# Patient Record
Sex: Male | Born: 1945 | Race: Black or African American | Hispanic: No | Marital: Married | Smoking: Former smoker
Health system: Southern US, Community
[De-identification: ages and names within clinical notes are randomized; demographics above are authoritative.]

## PROBLEM LIST (undated history)

## (undated) DIAGNOSIS — I5022 Chronic systolic (congestive) heart failure: Secondary | ICD-10-CM

## (undated) DIAGNOSIS — N184 Chronic kidney disease, stage 4 (severe): Secondary | ICD-10-CM

## (undated) DIAGNOSIS — R0989 Other specified symptoms and signs involving the circulatory and respiratory systems: Secondary | ICD-10-CM

## (undated) DIAGNOSIS — I2510 Atherosclerotic heart disease of native coronary artery without angina pectoris: Secondary | ICD-10-CM

## (undated) DIAGNOSIS — G4733 Obstructive sleep apnea (adult) (pediatric): Secondary | ICD-10-CM

## (undated) DIAGNOSIS — E119 Type 2 diabetes mellitus without complications: Secondary | ICD-10-CM

## (undated) DIAGNOSIS — I428 Other cardiomyopathies: Secondary | ICD-10-CM

## (undated) DIAGNOSIS — I10 Essential (primary) hypertension: Secondary | ICD-10-CM

## (undated) DIAGNOSIS — Z720 Tobacco use: Secondary | ICD-10-CM

## (undated) HISTORY — PX: ABDOMINAL HERNIA REPAIR: SHX539

## (undated) HISTORY — DX: Obstructive sleep apnea (adult) (pediatric): G47.33

## (undated) HISTORY — PX: CARDIAC CATHETERIZATION: SHX172

## (undated) HISTORY — DX: Other specified symptoms and signs involving the circulatory and respiratory systems: R09.89

---

## 2007-02-17 DIAGNOSIS — R0989 Other specified symptoms and signs involving the circulatory and respiratory systems: Secondary | ICD-10-CM

## 2007-02-17 HISTORY — DX: Other specified symptoms and signs involving the circulatory and respiratory systems: R09.89

## 2016-03-27 DIAGNOSIS — R0600 Dyspnea, unspecified: Secondary | ICD-10-CM | POA: Diagnosis not present

## 2016-03-28 ENCOUNTER — Inpatient Hospital Stay
Admission: EM | Admit: 2016-03-28 | Discharge: 2016-04-04 | DRG: 280 | Disposition: A | Discharge status: Home Health | Payer: Medicare Other | Attending: Internal Medicine | Admitting: Internal Medicine

## 2016-03-28 ENCOUNTER — Inpatient Hospital Stay (HOSPITAL_COMMUNITY)
Admit: 2016-03-28 | Discharge: 2016-03-28 | Disposition: A | Discharge status: Home / Self Care | Payer: Medicare Other | Attending: Internal Medicine | Admitting: Internal Medicine

## 2016-03-28 ENCOUNTER — Encounter: Payer: Self-pay | Admitting: Internal Medicine

## 2016-03-28 ENCOUNTER — Emergency Department: Payer: Medicare Other

## 2016-03-28 DIAGNOSIS — N184 Chronic kidney disease, stage 4 (severe): Secondary | ICD-10-CM | POA: Diagnosis not present

## 2016-03-28 DIAGNOSIS — I420 Dilated cardiomyopathy: Secondary | ICD-10-CM

## 2016-03-28 DIAGNOSIS — I5031 Acute diastolic (congestive) heart failure: Secondary | ICD-10-CM

## 2016-03-28 DIAGNOSIS — I10 Essential (primary) hypertension: Secondary | ICD-10-CM

## 2016-03-28 DIAGNOSIS — R338 Other retention of urine: Secondary | ICD-10-CM | POA: Diagnosis present

## 2016-03-28 DIAGNOSIS — I472 Ventricular tachycardia, unspecified: Secondary | ICD-10-CM

## 2016-03-28 DIAGNOSIS — J9601 Acute respiratory failure with hypoxia: Secondary | ICD-10-CM | POA: Diagnosis present

## 2016-03-28 DIAGNOSIS — N183 Chronic kidney disease, stage 3 unspecified: Secondary | ICD-10-CM

## 2016-03-28 DIAGNOSIS — E785 Hyperlipidemia, unspecified: Secondary | ICD-10-CM | POA: Diagnosis present

## 2016-03-28 DIAGNOSIS — I255 Ischemic cardiomyopathy: Secondary | ICD-10-CM | POA: Diagnosis not present

## 2016-03-28 DIAGNOSIS — F17200 Nicotine dependence, unspecified, uncomplicated: Secondary | ICD-10-CM

## 2016-03-28 DIAGNOSIS — N400 Enlarged prostate without lower urinary tract symptoms: Secondary | ICD-10-CM | POA: Diagnosis present

## 2016-03-28 DIAGNOSIS — I130 Hypertensive heart and chronic kidney disease with heart failure and stage 1 through stage 4 chronic kidney disease, or unspecified chronic kidney disease: Secondary | ICD-10-CM | POA: Diagnosis present

## 2016-03-28 DIAGNOSIS — I214 Non-ST elevation (NSTEMI) myocardial infarction: Principal | ICD-10-CM | POA: Diagnosis present

## 2016-03-28 DIAGNOSIS — N179 Acute kidney failure, unspecified: Secondary | ICD-10-CM | POA: Diagnosis not present

## 2016-03-28 DIAGNOSIS — E1122 Type 2 diabetes mellitus with diabetic chronic kidney disease: Secondary | ICD-10-CM | POA: Diagnosis not present

## 2016-03-28 DIAGNOSIS — E1121 Type 2 diabetes mellitus with diabetic nephropathy: Secondary | ICD-10-CM | POA: Diagnosis present

## 2016-03-28 DIAGNOSIS — I5033 Acute on chronic diastolic (congestive) heart failure: Secondary | ICD-10-CM | POA: Diagnosis not present

## 2016-03-28 DIAGNOSIS — I160 Hypertensive urgency: Secondary | ICD-10-CM | POA: Diagnosis not present

## 2016-03-28 DIAGNOSIS — I428 Other cardiomyopathies: Secondary | ICD-10-CM

## 2016-03-28 DIAGNOSIS — F17210 Nicotine dependence, cigarettes, uncomplicated: Secondary | ICD-10-CM | POA: Diagnosis present

## 2016-03-28 DIAGNOSIS — I5023 Acute on chronic systolic (congestive) heart failure: Secondary | ICD-10-CM | POA: Diagnosis not present

## 2016-03-28 DIAGNOSIS — R002 Palpitations: Secondary | ICD-10-CM | POA: Diagnosis not present

## 2016-03-28 DIAGNOSIS — M6250 Muscle wasting and atrophy, not elsewhere classified, unspecified site: Secondary | ICD-10-CM | POA: Diagnosis present

## 2016-03-28 DIAGNOSIS — J432 Centrilobular emphysema: Secondary | ICD-10-CM | POA: Diagnosis not present

## 2016-03-28 DIAGNOSIS — Z7984 Long term (current) use of oral hypoglycemic drugs: Secondary | ICD-10-CM | POA: Diagnosis not present

## 2016-03-28 DIAGNOSIS — I5043 Acute on chronic combined systolic (congestive) and diastolic (congestive) heart failure: Secondary | ICD-10-CM | POA: Diagnosis present

## 2016-03-28 DIAGNOSIS — N171 Acute kidney failure with acute cortical necrosis: Secondary | ICD-10-CM | POA: Diagnosis not present

## 2016-03-28 DIAGNOSIS — N2581 Secondary hyperparathyroidism of renal origin: Secondary | ICD-10-CM | POA: Diagnosis not present

## 2016-03-28 DIAGNOSIS — R531 Weakness: Secondary | ICD-10-CM | POA: Diagnosis present

## 2016-03-28 DIAGNOSIS — E1140 Type 2 diabetes mellitus with diabetic neuropathy, unspecified: Secondary | ICD-10-CM | POA: Diagnosis present

## 2016-03-28 DIAGNOSIS — R0603 Acute respiratory distress: Secondary | ICD-10-CM | POA: Diagnosis not present

## 2016-03-28 DIAGNOSIS — J438 Other emphysema: Secondary | ICD-10-CM

## 2016-03-28 DIAGNOSIS — I272 Pulmonary hypertension, unspecified: Secondary | ICD-10-CM | POA: Diagnosis not present

## 2016-03-28 DIAGNOSIS — I5022 Chronic systolic (congestive) heart failure: Secondary | ICD-10-CM | POA: Diagnosis not present

## 2016-03-28 DIAGNOSIS — R748 Abnormal levels of other serum enzymes: Secondary | ICD-10-CM | POA: Diagnosis not present

## 2016-03-28 DIAGNOSIS — I2510 Atherosclerotic heart disease of native coronary artery without angina pectoris: Secondary | ICD-10-CM | POA: Diagnosis not present

## 2016-03-28 DIAGNOSIS — Z79899 Other long term (current) drug therapy: Secondary | ICD-10-CM

## 2016-03-28 DIAGNOSIS — I509 Heart failure, unspecified: Secondary | ICD-10-CM

## 2016-03-28 DIAGNOSIS — Z720 Tobacco use: Secondary | ICD-10-CM

## 2016-03-28 HISTORY — DX: Essential (primary) hypertension: I10

## 2016-03-28 HISTORY — DX: Type 2 diabetes mellitus without complications: E11.9

## 2016-03-28 LAB — TSH: TSH: 2.607 u[IU]/mL (ref 0.350–4.500)

## 2016-03-28 LAB — CBC WITH DIFFERENTIAL/PLATELET
BASOS ABS: 0.1 10*3/uL (ref 0–0.1)
BASOS PCT: 2 %
Eosinophils Absolute: 0.4 10*3/uL (ref 0–0.7)
Eosinophils Relative: 5 %
HCT: 40.4 % (ref 40.0–52.0)
HEMOGLOBIN: 13.2 g/dL (ref 13.0–18.0)
Lymphocytes Relative: 23 %
Lymphs Abs: 1.7 10*3/uL (ref 1.0–3.6)
MCH: 29.2 pg (ref 26.0–34.0)
MCHC: 32.7 g/dL (ref 32.0–36.0)
MCV: 89.3 fL (ref 80.0–100.0)
Monocytes Absolute: 0.5 10*3/uL (ref 0.2–1.0)
Monocytes Relative: 7 %
NEUTROS PCT: 63 %
Neutro Abs: 4.8 10*3/uL (ref 1.4–6.5)
Platelets: 235 10*3/uL (ref 150–440)
RBC: 4.52 MIL/uL (ref 4.40–5.90)
RDW: 14.6 % — ABNORMAL HIGH (ref 11.5–14.5)
WBC: 7.5 10*3/uL (ref 3.8–10.6)

## 2016-03-28 LAB — GLUCOSE, CAPILLARY
Glucose-Capillary: 167 mg/dL — ABNORMAL HIGH (ref 65–99)
Glucose-Capillary: 161 mg/dL — ABNORMAL HIGH (ref 65–99)
Glucose-Capillary: 230 mg/dL — ABNORMAL HIGH (ref 65–99)

## 2016-03-28 LAB — COMPREHENSIVE METABOLIC PANEL
ALBUMIN: 3.6 g/dL (ref 3.5–5.0)
ALK PHOS: 56 U/L (ref 38–126)
ALT: 12 U/L — AB (ref 17–63)
ANION GAP: 8 (ref 5–15)
AST: 23 U/L (ref 15–41)
BUN: 22 mg/dL — AB (ref 6–20)
CO2: 24 mmol/L (ref 22–32)
Calcium: 8.4 mg/dL — ABNORMAL LOW (ref 8.9–10.3)
Chloride: 103 mmol/L (ref 101–111)
Creatinine, Ser: 2.07 mg/dL — ABNORMAL HIGH (ref 0.61–1.24)
GFR calc Af Amer: 35 mL/min — ABNORMAL LOW (ref >60)
GFR calc non Af Amer: 31 mL/min — ABNORMAL LOW (ref >60)
GLUCOSE: 187 mg/dL — AB (ref 65–99)
Potassium: 4.3 mmol/L (ref 3.5–5.1)
SODIUM: 135 mmol/L (ref 135–145)
Total Bilirubin: 0.6 mg/dL (ref 0.3–1.2)
Total Protein: 6.6 g/dL (ref 6.5–8.1)

## 2016-03-28 LAB — ECHOCARDIOGRAM COMPLETE
Height: 72 in
Weight: 2606.4 oz

## 2016-03-28 LAB — TROPONIN I
Troponin I: 0.07 ng/mL (ref <0.03)
Troponin I: 0.15 ng/mL (ref <0.03)
Troponin I: 1.12 ng/mL (ref <0.03)
Troponin I: 1.92 ng/mL (ref <0.03)

## 2016-03-28 LAB — BRAIN NATRIURETIC PEPTIDE: B Natriuretic Peptide: 1941 pg/mL — ABNORMAL HIGH (ref 0.0–100.0)

## 2016-03-28 MED ORDER — FUROSEMIDE 10 MG/ML IJ SOLN
60.0000 mg | Freq: Two times a day (BID) | INTRAMUSCULAR | Status: DC
  Administered 2016-03-28 – 2016-03-29 (×3): 60 mg via INTRAVENOUS
  Filled 2016-03-28 (×3): qty 6

## 2016-03-28 MED ORDER — NITROGLYCERIN 2 % TD OINT
TOPICAL_OINTMENT | TRANSDERMAL | Status: AC
  Administered 2016-03-28: 0.5 [in_us] via TOPICAL
  Filled 2016-03-28: qty 1

## 2016-03-28 MED ORDER — LABETALOL HCL 5 MG/ML IV SOLN
INTRAVENOUS | Status: AC
  Filled 2016-03-28: qty 4

## 2016-03-28 MED ORDER — INSULIN ASPART 100 UNIT/ML ~~LOC~~ SOLN
0.0000 [IU] | Freq: Every day | SUBCUTANEOUS | Status: DC
  Administered 2016-03-28: 2 [IU] via SUBCUTANEOUS
  Administered 2016-04-01: 3 [IU] via SUBCUTANEOUS
  Administered 2016-04-02: 2 [IU] via SUBCUTANEOUS
  Filled 2016-03-28: qty 2
  Filled 2016-03-28: qty 3
  Filled 2016-03-28: qty 2

## 2016-03-28 MED ORDER — VITAMIN B-12 1000 MCG PO TABS
1000.0000 ug | ORAL_TABLET | Freq: Every day | ORAL | Status: DC
  Administered 2016-03-28 – 2016-04-04 (×8): 1000 ug via ORAL
  Filled 2016-03-28 (×8): qty 1

## 2016-03-28 MED ORDER — HEPARIN (PORCINE) IN NACL 100-0.45 UNIT/ML-% IJ SOLN
1200.0000 [IU]/h | INTRAMUSCULAR | Status: DC
  Administered 2016-03-28: 900 [IU]/h via INTRAVENOUS
  Administered 2016-03-29 – 2016-03-30 (×2): 1050 [IU]/h via INTRAVENOUS
  Administered 2016-03-31: 1200 [IU]/h via INTRAVENOUS
  Filled 2016-03-28 (×4): qty 250

## 2016-03-28 MED ORDER — ENALAPRIL MALEATE 10 MG PO TABS
20.0000 mg | ORAL_TABLET | Freq: Two times a day (BID) | ORAL | Status: DC
  Administered 2016-03-28: 20 mg via ORAL
  Filled 2016-03-28: qty 2

## 2016-03-28 MED ORDER — LABETALOL HCL 5 MG/ML IV SOLN
10.0000 mg | Freq: Once | INTRAVENOUS | Status: AC
  Administered 2016-03-28: 10 mg via INTRAVENOUS

## 2016-03-28 MED ORDER — SODIUM CHLORIDE 0.9% FLUSH
3.0000 mL | Freq: Two times a day (BID) | INTRAVENOUS | Status: DC
  Administered 2016-03-28 – 2016-04-04 (×12): 3 mL via INTRAVENOUS

## 2016-03-28 MED ORDER — HEPARIN SODIUM (PORCINE) 5000 UNIT/ML IJ SOLN
5000.0000 [IU] | Freq: Three times a day (TID) | INTRAMUSCULAR | Status: DC
  Administered 2016-03-28 (×2): 5000 [IU] via SUBCUTANEOUS
  Filled 2016-03-28 (×2): qty 1

## 2016-03-28 MED ORDER — NITROGLYCERIN 2 % TD OINT
1.0000 [in_us] | TOPICAL_OINTMENT | Freq: Once | TRANSDERMAL | Status: AC
  Administered 2016-03-28: 1 [in_us] via TOPICAL

## 2016-03-28 MED ORDER — AMLODIPINE BESYLATE 5 MG PO TABS
5.0000 mg | ORAL_TABLET | Freq: Every day | ORAL | Status: DC
  Administered 2016-03-28: 5 mg via ORAL
  Filled 2016-03-28: qty 1

## 2016-03-28 MED ORDER — ISOSORBIDE MONONITRATE ER 30 MG PO TB24
30.0000 mg | ORAL_TABLET | Freq: Every day | ORAL | Status: DC
  Administered 2016-03-28 – 2016-03-30 (×3): 30 mg via ORAL
  Filled 2016-03-28 (×3): qty 1

## 2016-03-28 MED ORDER — PRAVASTATIN SODIUM 40 MG PO TABS
40.0000 mg | ORAL_TABLET | Freq: Every day | ORAL | Status: DC
  Administered 2016-03-28 – 2016-04-04 (×8): 40 mg via ORAL
  Filled 2016-03-28 (×8): qty 1

## 2016-03-28 MED ORDER — NITROGLYCERIN 2 % TD OINT
0.5000 [in_us] | TOPICAL_OINTMENT | Freq: Once | TRANSDERMAL | Status: AC
  Administered 2016-03-28: 0.5 [in_us] via TOPICAL

## 2016-03-28 MED ORDER — INSULIN ASPART 100 UNIT/ML ~~LOC~~ SOLN
0.0000 [IU] | Freq: Three times a day (TID) | SUBCUTANEOUS | Status: DC
  Administered 2016-03-28 (×2): 3 [IU] via SUBCUTANEOUS
  Administered 2016-03-29: 2 [IU] via SUBCUTANEOUS
  Administered 2016-03-29: 5 [IU] via SUBCUTANEOUS
  Administered 2016-03-30 – 2016-03-31 (×3): 3 [IU] via SUBCUTANEOUS
  Administered 2016-03-31: 8 [IU] via SUBCUTANEOUS
  Administered 2016-04-01: 3 [IU] via SUBCUTANEOUS
  Administered 2016-04-01: 2 [IU] via SUBCUTANEOUS
  Administered 2016-04-01: 5 [IU] via SUBCUTANEOUS
  Administered 2016-04-02 (×2): 3 [IU] via SUBCUTANEOUS
  Administered 2016-04-02: 5 [IU] via SUBCUTANEOUS
  Administered 2016-04-03: 2 [IU] via SUBCUTANEOUS
  Administered 2016-04-03: 5 [IU] via SUBCUTANEOUS
  Administered 2016-04-04: 2 [IU] via SUBCUTANEOUS
  Administered 2016-04-04: 3 [IU] via SUBCUTANEOUS
  Filled 2016-03-28: qty 5
  Filled 2016-03-28 (×3): qty 3
  Filled 2016-03-28 (×2): qty 5
  Filled 2016-03-28: qty 8
  Filled 2016-03-28 (×2): qty 2
  Filled 2016-03-28: qty 5
  Filled 2016-03-28: qty 2
  Filled 2016-03-28 (×4): qty 3
  Filled 2016-03-28: qty 2
  Filled 2016-03-28 (×2): qty 3

## 2016-03-28 MED ORDER — ALFUZOSIN HCL ER 10 MG PO TB24
10.0000 mg | ORAL_TABLET | Freq: Every day | ORAL | Status: DC
  Administered 2016-03-28 – 2016-04-03 (×7): 10 mg via ORAL
  Filled 2016-03-28 (×8): qty 1

## 2016-03-28 MED ORDER — ONDANSETRON HCL 4 MG/2ML IJ SOLN: 4.0000 mg | Freq: Four times a day (QID) | INTRAMUSCULAR | Status: DC | PRN

## 2016-03-28 MED ORDER — LABETALOL HCL 5 MG/ML IV SOLN
INTRAVENOUS | Status: AC
  Administered 2016-03-28: 10 mg via INTRAVENOUS
  Filled 2016-03-28: qty 4

## 2016-03-28 MED ORDER — AMLODIPINE BESYLATE 5 MG PO TABS
5.0000 mg | ORAL_TABLET | Freq: Two times a day (BID) | ORAL | Status: DC
  Administered 2016-03-28 – 2016-03-30 (×4): 5 mg via ORAL
  Filled 2016-03-28 (×4): qty 1

## 2016-03-28 MED ORDER — ACETAMINOPHEN 650 MG RE SUPP: 650.0000 mg | Freq: Four times a day (QID) | RECTAL | Status: DC | PRN

## 2016-03-28 MED ORDER — TAMSULOSIN HCL 0.4 MG PO CAPS
0.4000 mg | ORAL_CAPSULE | Freq: Every day | ORAL | Status: DC
  Administered 2016-03-28 – 2016-04-04 (×8): 0.4 mg via ORAL
  Filled 2016-03-28 (×8): qty 1

## 2016-03-28 MED ORDER — DOCUSATE SODIUM 100 MG PO CAPS
100.0000 mg | ORAL_CAPSULE | Freq: Two times a day (BID) | ORAL | Status: DC
  Administered 2016-03-28 – 2016-04-04 (×15): 100 mg via ORAL
  Filled 2016-03-28 (×16): qty 1

## 2016-03-28 MED ORDER — PANTOPRAZOLE SODIUM 40 MG PO TBEC
80.0000 mg | DELAYED_RELEASE_TABLET | Freq: Every day | ORAL | Status: DC
  Administered 2016-03-28 – 2016-03-31 (×4): 80 mg via ORAL
  Filled 2016-03-28 (×4): qty 2

## 2016-03-28 MED ORDER — HYDRALAZINE HCL 50 MG PO TABS
100.0000 mg | ORAL_TABLET | Freq: Three times a day (TID) | ORAL | Status: DC
  Administered 2016-03-28 – 2016-04-04 (×22): 100 mg via ORAL
  Filled 2016-03-28 (×22): qty 2

## 2016-03-28 MED ORDER — ONDANSETRON HCL 4 MG PO TABS: 4.0000 mg | ORAL_TABLET | Freq: Four times a day (QID) | ORAL | Status: DC | PRN

## 2016-03-28 MED ORDER — FUROSEMIDE 10 MG/ML IJ SOLN
60.0000 mg | Freq: Once | INTRAMUSCULAR | Status: AC
  Administered 2016-03-28: 60 mg via INTRAVENOUS

## 2016-03-28 MED ORDER — ACETAMINOPHEN 325 MG PO TABS: 650.0000 mg | ORAL_TABLET | Freq: Four times a day (QID) | ORAL | Status: DC | PRN

## 2016-03-28 MED ORDER — FUROSEMIDE 20 MG PO TABS
20.0000 mg | ORAL_TABLET | Freq: Every day | ORAL | Status: DC
  Administered 2016-03-28: 20 mg via ORAL
  Filled 2016-03-28: qty 1

## 2016-03-28 MED ORDER — ALFUZOSIN HCL ER 10 MG PO TB24: 10.0000 mg | ORAL_TABLET | Freq: Every day | ORAL | Status: DC

## 2016-03-28 NOTE — Progress Notes (Signed)
Notified Dr. Rockey Situ of patient's troponin 1.92. Patient does not have chest pain.

## 2016-03-28 NOTE — ED Notes (Signed)
Family at bedside. 

## 2016-03-28 NOTE — ED Notes (Signed)
Lab called with critical Trop of 0.07, MD notified.

## 2016-03-28 NOTE — H&P (Signed)
Colton Thompson is an 71 y.o. male.   Chief Complaint: Shortness of breath HPI: The patient with past medical history of congestive heart failure, hypertension and diabetes presents emergency department due to shortness of breath. The patient states that he was unable to catch his breath as he usually is able to rest. He has had orthopnea for quite some time and sleeps in a recliner. Tonight his dyspnea became too great and he was transported to the emergency department. He was immediately placed on BiPAP and given Lasix IV which improved his respiratory effort. The patient reports having a CPAP machine but is not consistent with use as it is uncomfortable while sleeping. The patient denies chest pain, nausea, vomiting or diaphoresis. Once the patient was stabilized in the emergency department the hospitalist service was called for admission.  Past Medical History:  Diagnosis Date  . CHF (congestive heart failure) ([place])   . DM (diabetes mellitus), type 2 ([place])   . HTN (hypertension)     Past Surgical History:  Procedure Laterality Date  . ABDOMINAL HERNIA REPAIR      Family History  Problem Relation Age of Onset  . Stroke Mother    Social History:  has no tobacco, alcohol, and drug history on file.  Allergies: No Known Allergies  Medications Prior to Admission  Medication Sig Dispense Refill  . albuterol (PROVENTIL HFA;VENTOLIN HFA) 108 (90 Base) MCG/ACT inhaler Inhale 1-2 puffs into the lungs every 6 (six) hours as needed for wheezing or shortness of breath.    . alfuzosin (UROXATRAL) 10 MG 24 hr tablet Take 10 mg by mouth at bedtime.    . enalapril (VASOTEC) 20 MG tablet Take 20 mg by mouth 2 (two) times daily.    . furosemide (LASIX) 20 MG tablet Take 20 mg by mouth daily.    . hydrALAZINE (APRESOLINE) 100 MG tablet Take 100 mg by mouth 3 (three) times daily.    . insulin glargine (LANTUS) 100 UNIT/ML injection Inject 15 Units into the skin at bedtime.    . metFORMIN (GLUCOPHAGE)  1000 MG tablet Take 1,000-1,500 mg by mouth 2 (two) times daily with a meal. 1016m in the morning and 1500 mg in the evening    . omeprazole (PRILOSEC) 20 MG capsule Take 20 mg by mouth 2 (two) times daily before a meal.    . pravastatin (PRAVACHOL) 40 MG tablet Take 40 mg by mouth daily.    . vitamin B-12 (CYANOCOBALAMIN) 1000 MCG tablet Take 1,000 mcg by mouth daily.      Results for orders placed or performed during the hospital encounter of 03/28/16 (from the past 48 hour(s))  CBC with Differential     Status: Abnormal   Collection Time: 03/28/16 12:06 AM  Result Value Ref Range   WBC 7.5 3.8 - 10.6 K/uL   RBC 4.52 4.40 - 5.90 MIL/uL   Hemoglobin 13.2 13.0 - 18.0 g/dL   HCT 40.4 40.0 - 52.0 %   MCV 89.3 80.0 - 100.0 fL   MCH 29.2 26.0 - 34.0 pg   MCHC 32.7 32.0 - 36.0 g/dL   RDW 14.6 (H) 11.5 - 14.5 %   Platelets 235 150 - 440 K/uL   Neutrophils Relative % 63 %   Neutro Abs 4.8 1.4 - 6.5 K/uL   Lymphocytes Relative 23 %   Lymphs Abs 1.7 1.0 - 3.6 K/uL   Monocytes Relative 7 %   Monocytes Absolute 0.5 0.2 - 1.0 K/uL   Eosinophils Relative 5 %  Eosinophils Absolute 0.4 0 - 0.7 K/uL   Basophils Relative 2 %   Basophils Absolute 0.1 0 - 0.1 K/uL  Comprehensive metabolic panel     Status: Abnormal   Collection Time: 03/28/16 12:06 AM  Result Value Ref Range   Sodium 135 135 - 145 mmol/L   Potassium 4.3 3.5 - 5.1 mmol/L   Chloride 103 101 - 111 mmol/L   CO2 24 22 - 32 mmol/L   Glucose, Bld 187 (H) 65 - 99 mg/dL   BUN 22 (H) 6 - 20 mg/dL   Creatinine, Ser 2.07 (H) 0.61 - 1.24 mg/dL   Calcium 8.4 (L) 8.9 - 10.3 mg/dL   Total Protein 6.6 6.5 - 8.1 g/dL   Albumin 3.6 3.5 - 5.0 g/dL   AST 23 15 - 41 U/L   ALT 12 (L) 17 - 63 U/L   Alkaline Phosphatase 56 38 - 126 U/L   Total Bilirubin 0.6 0.3 - 1.2 mg/dL   GFR calc non Af Amer 31 (L) >60 mL/min   GFR calc Af Amer 35 (L) >60 mL/min    Comment: (NOTE) The eGFR has been calculated using the CKD EPI equation. This calculation  has not been validated in all clinical situations. eGFR's persistently <60 mL/min signify possible Chronic Kidney Disease.    Anion gap 8 5 - 15  Troponin I     Status: Abnormal   Collection Time: 03/28/16 12:06 AM  Result Value Ref Range   Troponin I 0.07 (HH) <0.03 ng/mL    Comment: CRITICAL RESULT CALLED TO, READ BACK BY AND VERIFIED WITH DAVID WALKER AT 5621 03/28/16.PMH  Brain natriuretic peptide     Status: Abnormal   Collection Time: 03/28/16 12:06 AM  Result Value Ref Range   B Natriuretic Peptide 1,941.0 (H) 0.0 - 100.0 pg/mL  TSH     Status: None   Collection Time: 03/28/16  4:52 AM  Result Value Ref Range   TSH 2.607 0.350 - 4.500 uIU/mL    Comment: Performed by a 3rd Generation assay with a functional sensitivity of <=0.01 uIU/mL.  Troponin I     Status: Abnormal   Collection Time: 03/28/16  4:52 AM  Result Value Ref Range   Troponin I 0.15 (HH) <0.03 ng/mL    Comment: CRITICAL VALUE NOTED. VALUE IS CONSISTENT WITH PREVIOUSLY REPORTED/CALLED VALUE.PMH   Dg Chest 1 View  Result Date: 03/28/2016 CLINICAL DATA:  Respiratory distress.  Emesis. EXAM: CHEST 1 VIEW COMPARISON:  None. FINDINGS: Normal heart size and pulmonary vascularity. Diffuse perihilar peribronchial thickening with diffuse interstitial pattern to the lungs. This may represent interstitial pneumonia or edema. No blunting of costophrenic angles. No pneumothorax. Mediastinal contours appear intact. Calcification of the aorta. IMPRESSION: Peribronchial thickening with diffuse interstitial pattern to the lungs suggesting interstitial pneumonia or edema. Electronically Signed   By: Lucienne Capers M.D.   On: 03/28/2016 00:25    Review of Systems  Constitutional: Negative for chills and fever.  HENT: Negative for sore throat and tinnitus.   Eyes: Negative for blurred vision and redness.  Respiratory: Positive for shortness of breath. Negative for cough.   Cardiovascular: Positive for orthopnea. Negative for chest  pain, palpitations and PND.  Gastrointestinal: Negative for abdominal pain, diarrhea, nausea and vomiting.  Genitourinary: Negative for dysuria, frequency and urgency.  Musculoskeletal: Negative for joint pain and myalgias.  Skin: Negative for rash.       No lesions  Neurological: Negative for speech change, focal weakness and weakness.  Endo/Heme/Allergies:  Does not bruise/bleed easily.       No temperature intolerance  Psychiatric/Behavioral: Negative for depression and suicidal ideas.    Blood pressure (!) 157/83, pulse 79, temperature 98.3 F (36.8 C), temperature source Oral, resp. rate 16, height 6' (1.829 m), weight 73.9 kg (162 lb 14.4 oz), SpO2 97 %. Physical Exam  Constitutional: He is oriented to person, place, and time. He appears well-developed and well-nourished. No distress.  HENT:  Head: Normocephalic and atraumatic.  Mouth/Throat: Oropharynx is clear and moist.  Eyes: Conjunctivae and EOM are normal. Pupils are equal, round, and reactive to light. No scleral icterus.  Neck: Normal range of motion. Neck supple. No JVD present. No tracheal deviation present. No thyromegaly present.  Cardiovascular: Normal rate, regular rhythm and normal heart sounds.  Exam reveals no gallop and no friction rub.   No murmur heard. Respiratory: Breath sounds normal. He is in respiratory distress. He has no wheezes. He has no rales.  On BiPAP  GI: Soft. Bowel sounds are normal. He exhibits no distension. There is no tenderness.  Genitourinary:  Genitourinary Comments: Deferred  Musculoskeletal: Normal range of motion. He exhibits no edema.  Lymphadenopathy:    He has no cervical adenopathy.  Neurological: He is alert and oriented to person, place, and time. No cranial nerve deficit.  Skin: Skin is warm and dry. No rash noted. No erythema.  Psychiatric: He has a normal mood and affect. His behavior is normal. Judgment and thought content normal.     Assessment/Plan This is a  71 year old male admitted for CHF exacerbation. 1. Congestive heart failure: Acute on chronic; likely combined systolic and diastolic. Continue diuresis. Adjust Lasix dose as necessary. Blood pressure is uncontrolled and likely worsening cardiac output. 2. Hypertension: Uncontrolled: Continue hydralazine and amlodipine. Labetalol as needed. The patient has Nitropaste on his chest. Consider holding enalapril due to acute kidney injury. 3. Acute kidney injury: Baseline creatinine unavailable. 4. Diabetes mellitus: Check hemoglobin A1c; sliding scale insulin while hospitalized hold oral hyperglycemic agents. 5. Hyperlipidemia: Continue statin therapy 6. BPH: Continue Uroxatral 7. DVT prophylaxis: Heparin 8. GI prophylaxis: Pantoprazole per home regimen The patient is a full code. I'm spent on admission orders and patient care approximately 45 minutes  Harrie Foreman, MD 03/28/2016, 7:11 AM

## 2016-03-28 NOTE — ED Provider Notes (Signed)
[facility] Emergency Department Provider Note   First MD Initiated Contact with Patient 03/28/16 0009     (approximate)  I have reviewed the triage vital signs and the nursing notes.   HISTORY  Chief Complaint Respiratory Distress   HPI Colton Thompson is a 71 y.o. male with history of congestive heart failure presents to the emergency department via EMS for respiratory distress. Per EMS initial call was for shortness of breath on their arrival the patient's oxygen saturation was 92% on room air with considerable work of breathing and in addition the patient's blood pressure on arrival was 230/150. Patient's blood pressure arrival to the emergency department 225/123 status post 3 sublingual nitroglycerin that were administered by EMS. EMS also notified that the patient had 1 episode of vomiting with. To be food particles followed by another episode of vomiting which was frothy sputum. Patient denies any chest pain.   Past Medical History:  Diagnosis Date  . CHF (congestive heart failure) ([place])   . DM (diabetes mellitus), type 2 ([place])   . HTN (hypertension)     Patient Active Problem List   Diagnosis Date Noted  . Acute on chronic combined systolic and diastolic CHF (congestive heart failure) ([place]) 03/28/2016  . Acute renal failure superimposed on stage 3 chronic kidney disease ([place])   . Dilated cardiomyopathy ([place])   . Hypertensive urgency   . Centrilobular emphysema ([place])   . Smoker   . Pulmonary hypertension     Past Surgical History:  Procedure Laterality Date  . ABDOMINAL HERNIA REPAIR      Prior to Admission medications   Medication Sig Start Date End Date Taking? Authorizing Provider  albuterol (PROVENTIL HFA;VENTOLIN HFA) 108 (90 Base) MCG/ACT inhaler Inhale 1-2 puffs into the lungs every 6 (six) hours as needed for wheezing or shortness of breath.   Yes Historical Provider, MD  alfuzosin (UROXATRAL) 10 MG 24 hr tablet Take 10 mg by mouth  at bedtime.   Yes Historical Provider, MD  enalapril (VASOTEC) 20 MG tablet Take 20 mg by mouth 2 (two) times daily.   Yes Historical Provider, MD  furosemide (LASIX) 20 MG tablet Take 20 mg by mouth daily.   Yes Historical Provider, MD  hydrALAZINE (APRESOLINE) 100 MG tablet Take 100 mg by mouth 3 (three) times daily.   Yes Historical Provider, MD  insulin glargine (LANTUS) 100 UNIT/ML injection Inject 15 Units into the skin at bedtime.   Yes Historical Provider, MD  metFORMIN (GLUCOPHAGE) 1000 MG tablet Take 1,000-1,500 mg by mouth 2 (two) times daily with a meal. 1000mg  in the morning and 1500 mg in the evening   Yes Historical Provider, MD  omeprazole (PRILOSEC) 20 MG capsule Take 20 mg by mouth 2 (two) times daily before a meal.   Yes Historical Provider, MD  pravastatin (PRAVACHOL) 40 MG tablet Take 40 mg by mouth daily.   Yes Historical Provider, MD  vitamin B-12 (CYANOCOBALAMIN) 1000 MCG tablet Take 1,000 mcg by mouth daily.   Yes Historical Provider, MD    Allergies Patient has no known allergies.  Family History  Problem Relation Age of Onset  . Stroke Mother     Social History Social History  Substance Use Topics  . Smoking status: Not on file  . Smokeless tobacco: Not on file  . Alcohol use Not on file    Review of Systems Constitutional: No fever/chills Eyes: No visual changes. ENT: No sore throat. Cardiovascular: Denies chest pain. Respiratory: Positive  for shortness of breath. Gastrointestinal: No abdominal pain.  No nausea,Positive for vomiting Genitourinary: Negative for dysuria. Musculoskeletal: Negative for back pain. Skin: Negative for rash. Neurological: Negative for headaches, focal weakness or numbness.  10-point ROS otherwise negative.  ____________________________________________   PHYSICAL EXAM:  VITAL SIGNS: ED Triage Vitals  Enc Vitals Group     BP 03/28/16 0005 (!) 225/123     Pulse Rate 03/28/16 0005 (!) 115     Resp 03/28/16 0005 (!) 26      Temp 03/28/16 0005 (!) 94.3 F (34.6 C)     Temp Source 03/28/16 0005 Axillary     SpO2 03/28/16 0005 100 %     Weight 03/28/16 0006 162 lb (73.5 kg)     Height --      Head Circumference --      Peak Flow --      Pain Score --      Pain Loc --      Pain Edu? --      Excl. in [place]? --     Constitutional: Alert and oriented. Apparent respiratory distress Eyes: Conjunctivae are normal. PERRL. EOMI. Head: Atraumatic. Nose: No congestion/rhinnorhea. Mouth/Throat: Mucous membranes are moist. Oropharynx non-erythematous. Neck: No stridor.   Cardiovascular: Tachycardia, regular rhythm. Good peripheral circulation. Diastolic murmur noted Respiratory: Tachypnea, positive accessory respiratory muscle use, Bibasilar rales. Speaking in 3 word phrases Gastrointestinal: Soft and nontender. No distention.  Musculoskeletal: No lower extremity tenderness nor edema. No gross deformities of extremities. Neurologic:  Normal speech and language. No gross focal neurologic deficits are appreciated.  Skin:  Skin is warm, dry and intact. No rash noted. Psychiatric: Mood and affect are normal. Speech and behavior are normal.  ____________________________________________   LABS (all labs ordered are listed, but only abnormal results are displayed)  Labs Reviewed  CBC WITH DIFFERENTIAL/PLATELET - Abnormal; Notable for the following:       Result Value   RDW 14.6 (*)    All other components within normal limits  COMPREHENSIVE METABOLIC PANEL - Abnormal; Notable for the following:    Glucose, Bld 187 (*)    BUN 22 (*)    Creatinine, Ser 2.07 (*)    Calcium 8.4 (*)    ALT 12 (*)    GFR calc non Af Amer 31 (*)    GFR calc Af Amer 35 (*)    All other components within normal limits  TROPONIN I - Abnormal; Notable for the following:    Troponin I 0.07 (*)    All other components within normal limits  BRAIN NATRIURETIC PEPTIDE - Abnormal; Notable for the following:    B Natriuretic Peptide 1,941.0  (*)    All other components within normal limits  TROPONIN I - Abnormal; Notable for the following:    Troponin I 0.15 (*)    All other components within normal limits  TROPONIN I - Abnormal; Notable for the following:    Troponin I 1.12 (*)    All other components within normal limits  TROPONIN I - Abnormal; Notable for the following:    Troponin I 1.92 (*)    All other components within normal limits  GLUCOSE, CAPILLARY - Abnormal; Notable for the following:    Glucose-Capillary 167 (*)    All other components within normal limits  GLUCOSE, CAPILLARY - Abnormal; Notable for the following:    Glucose-Capillary 161 (*)    All other components within normal limits  GLUCOSE, CAPILLARY - Abnormal; Notable for the following:  Glucose-Capillary 230 (*)    All other components within normal limits  TSH  HEMOGLOBIN A1C  HEPARIN LEVEL (UNFRACTIONATED)   ____________________________________________  EKG  ED ECG REPORT I, [place] N BROWN, the attending physician, personally viewed and interpreted this ECG.   Date: 03/28/2016  EKG Time: 12:09 AM  Rate: 115  Rhythm: Sinus tachycardia  Axis: Normal  Intervals: Normal  ST&T Change: None  ____________________________________________  RADIOLOGY I, [place] Ernst Bowler, personally viewed and evaluated these images (plain radiographs) as part of my medical decision making, as well as reviewing the written report by the radiologist.  Dg Chest 1 View  Result Date: 03/28/2016 CLINICAL DATA:  Respiratory distress.  Emesis. EXAM: CHEST 1 VIEW COMPARISON:  None. FINDINGS: Normal heart size and pulmonary vascularity. Diffuse perihilar peribronchial thickening with diffuse interstitial pattern to the lungs. This may represent interstitial pneumonia or edema. No blunting of costophrenic angles. No pneumothorax. Mediastinal contours appear intact. Calcification of the aorta. IMPRESSION: Peribronchial thickening with diffuse interstitial pattern to  the lungs suggesting interstitial pneumonia or edema. Electronically Signed   By: Lucienne Capers M.D.   On: 03/28/2016 00:25      Procedures   Critical Care performed: CRITICAL CARE Performed by: Gregor Hams   Total critical care time: 45 minutes  Critical care time was exclusive of separately billable procedures and treating other patients.  Critical care was necessary to treat or prevent imminent or life-threatening deterioration.  Critical care was time spent personally by me on the following activities: development of treatment plan with patient and/or surrogate as well as nursing, discussions with consultants, evaluation of patient's response to treatment, examination of patient, obtaining history from patient or surrogate, ordering and performing treatments and interventions, ordering and review of laboratory studies, ordering and review of radiographic studies, pulse oximetry and re-evaluation of patient's condition.  ___________   INITIAL IMPRESSION / ASSESSMENT AND PLAN / ED COURSE  Pertinent labs & imaging results that were available during my care of the patient were reviewed by me and considered in my medical decision making (see chart for details).  71 year old male presenting to the emergency department with rest or distress most likely secondary to congestive heart failure exacerbation with possible pulmonary edema. Patient markedly hypertensive on arrival. BiPAP applied immediately upon arrival to the emergency department Lasix 60 mg IV given 1 inch of Nitropaste applied to the patient's right chest. 60 mg of Lasix administered IV. In addition the patient received labetalol 20 mg IV. Following these interventions patient respiratory effort improved as hypertension improved. Patient now able to speak in full sentences. Patient discussed with Dr. Marcille Blanco hospitalist for admission for further evaluation and management.       ____________________________________________  FINAL CLINICAL IMPRESSION(S) / ED DIAGNOSES  Final diagnoses:  None     MEDICATIONS GIVEN DURING THIS VISIT:  Medications  labetalol (NORMODYNE,TRANDATE) 5 MG/ML injection (  Canceled Entry 03/28/16 0750)  hydrALAZINE (APRESOLINE) tablet 100 mg (100 mg Oral Given 03/28/16 2226)  pravastatin (PRAVACHOL) tablet 40 mg (40 mg Oral Given 03/28/16 0812)  vitamin B-12 (CYANOCOBALAMIN) tablet 1,000 mcg (1,000 mcg Oral Given 03/28/16 0812)  alfuzosin (UROXATRAL) 24 hr tablet 10 mg (10 mg Oral Given 03/28/16 2225)  pantoprazole (PROTONIX) EC tablet 80 mg (80 mg Oral Given 03/28/16 0810)  sodium chloride flush (NS) 0.9 % injection 3 mL (3 mLs Intravenous Given 03/28/16 0813)  ondansetron (ZOFRAN) tablet 4 mg (not administered)    Or  ondansetron (ZOFRAN) injection 4 mg (not administered)  docusate sodium (COLACE) capsule 100 mg (100 mg Oral Given 03/28/16 2225)  acetaminophen (TYLENOL) tablet 650 mg (not administered)    Or  acetaminophen (TYLENOL) suppository 650 mg (not administered)  insulin aspart (novoLOG) injection 0-15 Units (3 Units Subcutaneous Given 03/28/16 1720)  insulin aspart (novoLOG) injection 0-5 Units (2 Units Subcutaneous Given 03/28/16 2226)  furosemide (LASIX) injection 60 mg (60 mg Intravenous Given 03/28/16 1718)  tamsulosin (FLOMAX) capsule 0.4 mg (0.4 mg Oral Given 03/28/16 1530)  amLODipine (NORVASC) tablet 5 mg (5 mg Oral Given 03/28/16 2225)  isosorbide mononitrate (IMDUR) 24 hr tablet 30 mg (30 mg Oral Given 03/28/16 1719)  heparin ADULT infusion 100 units/mL (25000 units/224mL sodium chloride 0.45%) (900 Units/hr Intravenous Handoff 03/28/16 1914)  furosemide (LASIX) injection 60 mg (60 mg Intravenous Given 03/28/16 0009)  nitroGLYCERIN (NITROGLYN) 2 % ointment 1 inch (1 inch Topical Given 03/28/16 0009)  labetalol (NORMODYNE,TRANDATE) injection 10 mg (10 mg Intravenous Given 03/28/16 0029)  labetalol (NORMODYNE,TRANDATE)  injection 10 mg (10 mg Intravenous Given 03/28/16 0049)  nitroGLYCERIN (NITROGLYN) 2 % ointment 0.5 inch (0.5 inches Topical Given 03/28/16 0132)     NEW OUTPATIENT MEDICATIONS STARTED DURING THIS VISIT:  Current Discharge Medication List      Current Discharge Medication List      Current Discharge Medication List       Note:  This document was prepared using Dragon voice recognition software and may include unintentional dictation errors.    Gregor Hams, MD 03/28/16 2242

## 2016-03-28 NOTE — ED Notes (Addendum)
0100 Vitals sync for pulse is inaccurate, patient is AOx4.

## 2016-03-28 NOTE — Consult Note (Addendum)
Cardiology Consultation Note  Patient ID: Colton Thompson, MRN: [id number], DOB/AGE: 1946/01/13 71 y.o. Admit date: 03/28/2016   Date of Consult: 03/28/2016 Primary Physician: Pcp Not In System Primary Cardiologist: New to [facility]  Chief Complaint: Shortness of breath, acute Reason for Consult: Congestive heart failure, elevated troponin, severe hypertension Physician requesting consult: Dr. Marcille Blanco   HPI: 71 y.o. male with h/o severe hypertension managed to the [facility], 49 year smoking history who continues to smoke , BPH, prior history of "congestive heart failure"per the patient, prior cardiac catheterization 20 years ago results unavailable who presents to the hospital after developing, acute onset of shortness of breath while Tronic of sleep last night.   In general he reports that he sleeps well with no symptoms. Denies any tachycardia or palpitations. Was lying in bed, slight incline with pillows, when he had significant difficulty breathing. Symptoms persisted. Occasionally sleeps in a recliner secondary to orthopnea Shortness of breath last night was beyond his normal baseline The patient reports having a CPAP machine but is not consistent with use as it is uncomfortable while sleeping.  He was given Lasix in the emergency room also started on BiPAP with improvement of his shortness of breath symptoms. Given labetalol for severe hypertension. Also started on Nitropaste. Initial troponin low, repeat troponin 0.12, follow-up troponin #3 was 1.12 Blood pressure on arrival was 225/125 Wife reports that he's been having trouble with his blood pressure . They do have a blood pressure cuff at home  He's had difficulty urinating, since he has been inpatient has required periodic catheterizations for urine retention. At home takes prostate medication, uroxatrol. Flomax has been added here Blood pressure continues to run high, 177 this evening systolic  For blood pressure takes hydralazine  100 mrem 3 times a day, enalapril 20 mg daily Amlodipine 5 mg has been added  He does have a smoking history for 50 years and continues to smoke    Past Medical History:  Diagnosis Date  . CHF (congestive heart failure) ([place])   . DM (diabetes mellitus), type 2 ([place])   . HTN (hypertension)       Most Recent Cardiac Studies: Echocardiogram performed today showing mildly dilated left ventricle, mild LVH, moderately reduced LV function , ejection fraction 20 to 25%, mildly dilated left atrium    Surgical History:  Past Surgical History:  Procedure Laterality Date  . ABDOMINAL HERNIA REPAIR       Home Meds: Prior to Admission medications   Medication Sig Start Date End Date Taking? Authorizing Provider  albuterol (PROVENTIL HFA;VENTOLIN HFA) 108 (90 Base) MCG/ACT inhaler Inhale 1-2 puffs into the lungs every 6 (six) hours as needed for wheezing or shortness of breath.   Yes Historical Provider, MD  alfuzosin (UROXATRAL) 10 MG 24 hr tablet Take 10 mg by mouth at bedtime.   Yes Historical Provider, MD  enalapril (VASOTEC) 20 MG tablet Take 20 mg by mouth 2 (two) times daily.   Yes Historical Provider, MD  furosemide (LASIX) 20 MG tablet Take 20 mg by mouth daily.   Yes Historical Provider, MD  hydrALAZINE (APRESOLINE) 100 MG tablet Take 100 mg by mouth 3 (three) times daily.   Yes Historical Provider, MD  insulin glargine (LANTUS) 100 UNIT/ML injection Inject 15 Units into the skin at bedtime.   Yes Historical Provider, MD  metFORMIN (GLUCOPHAGE) 1000 MG tablet Take 1,000-1,500 mg by mouth 2 (two) times daily with a meal. 1000mg  in the morning and 1500 mg in  the evening   Yes Historical Provider, MD  omeprazole (PRILOSEC) 20 MG capsule Take 20 mg by mouth 2 (two) times daily before a meal.   Yes Historical Provider, MD  pravastatin (PRAVACHOL) 40 MG tablet Take 40 mg by mouth daily.   Yes Historical Provider, MD  vitamin B-12 (CYANOCOBALAMIN) 1000 MCG tablet Take 1,000 mcg by mouth daily.    Yes Historical Provider, MD    Inpatient Medications:  . alfuzosin  10 mg Oral QHS  . amLODipine  5 mg Oral Daily  . docusate sodium  100 mg Oral BID  . enalapril  20 mg Oral BID  . furosemide  60 mg Intravenous BID  . heparin  5,000 Units Subcutaneous Q8H  . hydrALAZINE  100 mg Oral TID  . insulin aspart  0-15 Units Subcutaneous TID WC  . insulin aspart  0-5 Units Subcutaneous QHS  . pantoprazole  80 mg Oral Daily  . pravastatin  40 mg Oral Daily  . sodium chloride flush  3 mL Intravenous Q12H  . tamsulosin  0.4 mg Oral Daily  . vitamin B-12  1,000 mcg Oral Daily     Allergies: No Known Allergies  Social History   Social History  . Marital status: Married    Spouse name: N/A  . Number of children: N/A  . Years of education: N/A   Occupational History  . Not on file.   Social History Main Topics  . Smoking status: Not on file  . Smokeless tobacco: Not on file  . Alcohol use Not on file  . Drug use: Unknown  . Sexual activity: Not on file   Other Topics Concern  . Not on file   Social History Narrative  . No narrative on file     Family History  Problem Relation Age of Onset  . Stroke Mother      Review of Systems: Review of Systems  Constitutional: Positive for malaise/fatigue.  Respiratory: Positive for shortness of breath.   Cardiovascular: Negative.   Gastrointestinal: Negative.   Genitourinary:       Urine retention  Musculoskeletal: Negative.   Neurological: Negative.   Psychiatric/Behavioral: Negative.   All other systems reviewed and are negative.   Labs: CBC  Recent Labs  03/28/16 0006  WBC 7.5  NEUTROABS 4.8  HGB 13.2  HCT 40.4  MCV 89.3  PLT 024   Basic Metabolic Panel  Recent Labs  03/28/16 0006  NA 135  K 4.3  CL 103  CO2 24  GLUCOSE 187*  BUN 22*  CREATININE 2.07*  CALCIUM 8.4*   Liver Function Tests  Recent Labs  03/28/16 0006  AST 23  ALT 12*  ALKPHOS 56  BILITOT 0.6  PROT 6.6  ALBUMIN 3.6   No  results for input(s): LIPASE, AMYLASE in the last 72 hours. Cardiac Enzymes  Recent Labs  03/28/16 0006 03/28/16 0452 03/28/16 1038  TROPONINI 0.07* 0.15* 1.12*   BNP Invalid input(s): POCBNP D-Dimer No results for input(s): DDIMER in the last 72 hours. Hemoglobin A1C No results for input(s): HGBA1C in the last 72 hours. Fasting Lipid Panel No results for input(s): CHOL, HDL, LDLCALC, TRIG, CHOLHDL, LDLDIRECT in the last 72 hours. Thyroid Function Tests  Recent Labs  03/28/16 0452  TSH 2.607    Radiology/Studies:  Dg Chest 1 View  Result Date: 03/28/2016 CLINICAL DATA:  Respiratory distress.  Emesis. EXAM: CHEST 1 VIEW COMPARISON:  None. FINDINGS: Normal heart size and pulmonary vascularity. Diffuse perihilar peribronchial thickening with  diffuse interstitial pattern to the lungs. This may represent interstitial pneumonia or edema. No blunting of costophrenic angles. No pneumothorax. Mediastinal contours appear intact. Calcification of the aorta. IMPRESSION: Peribronchial thickening with diffuse interstitial pattern to the lungs suggesting interstitial pneumonia or edema. Electronically Signed   By: Lucienne Capers M.D.   On: 03/28/2016 00:25    EKG: EKG shows normal sinus rhythm with nonspecific ST and T wave abnormality anterolateral leads  EKG nonspecific ST and T wave abnormality lab work, chest x-ray, echocardiogram reviewed independently by myself  Weights: Filed Weights   03/28/16 0006 03/28/16 0438  Weight: 162 lb (73.5 kg) 162 lb 14.4 oz (73.9 kg)     Physical Exam:NSR Blood pressure (!) 177/96, pulse 81, temperature 98 F (36.7 C), temperature source Oral, resp. rate 20, height 6' (1.829 m), weight 162 lb 14.4 oz (73.9 kg), SpO2 98 %. Body mass index is 22.09 kg/m. GEN: Well nourished, well developed, in no acute distress.  HEENT: Grossly normal.  Neck: Supple, no JVD, carotid bruits, or masses. Cardiac: RRR, no murmurs, rubs, or gallops. No clubbing,  cyanosis, edema.  Radials/DP/PT 2+ and equal bilaterally.  Respiratory:  Respirations regular and unlabored, moderately decreased breath sounds throughout  GI: Soft, nontender, nondistended, BS + x 4. MS: no deformity or atrophy. Skin: warm and dry, no rash. Neuro:  Strength and sensation are intact. Psych: AAOx3.  Normal affect.    Assessment and Plan:   71 year old gentleman that presents with acute shortness of breath, prior history of CHF per his report, managed at the [place] , underlying renal failure creatinine 2.0 , ejection fraction 20 to 25% , long history of smoking who continues to smoke , chest x-ray concerning for edema, troponin up to 1.12 and the setting of severe hypertension on arrival 225/125   1) elevated troponin unclear at this time whether this is demand ischemia from severe hypertension, with underlying cardiomyopathy or whether this is a NSTEMI. My suspicion is he may have underlying coronary disease given 50 years of smoking, depressed ejection fraction. -Unable to pursue catheterization right away given creatinine 2.0, Long discussion with him concerning possible workup for ischemia including catheterization risk and benefit, as well as Myoview Will monitor renal function over the next 2 days, we could consider catheterization on Monday 2/12  2) cardiomyopathy Unclear if this is ischemic or dilated nonischemic Though given long history of smoking, and elevated troponin, more concerning for ischemic cardiomyopathy, also in the setting of chronic severe hypertension Elevated troponin 1.9 Consider adding low-dose beta blocker tomorrow Aspirin, continue statin  3) renal failure, stage III Unclear if this is acute or chronic, lab work from the [place] not available Possibly secondary to hydronephrosis given urine retention We will trend his renal function now that he has close monitoring of urine retention and requiring periodic Foley in and out cath  4) acute on chronic  systolic CHF Ejection fraction  20 to 25% Workup as above, may need catheterization. For worsening renal function may need Myoview. BNP markedly elevated 1900 Currently on Lasix IV 60 mg twice a day If creatinine/bun climbs, we could hold the Lasix.   5) hypertensive urgency 225/125 when he reached the floor per the notes Possibly contributed to troponin elevation, likely has underlying hypertensive heart disease We'll increase amlodipine up to 10 mg daily, continue hydralazine, we have added isosorbide Recommend we hold enalapril given need for catheterization, elevated creatinine We could also add carvedilol tomorrow   Long discussion with family concerning  the above Discussed testing procedures such as catheterization, stress test Discussed possible causes of his underlying renal dysfunction  Total encounter time more than 110 minutes  Greater than 50% was spent in counseling and coordination of care with the patient   Signed, Esmond Plants, MD, Ph.D [facility] 03/28/2016

## 2016-03-28 NOTE — Progress Notes (Signed)
*  PRELIMINARY RESULTS* Echocardiogram 2D Echocardiogram has been performed.  Colton Thompson 03/28/2016, 1:29 PM

## 2016-03-28 NOTE — Progress Notes (Signed)
ANTICOAGULATION CONSULT NOTE - Initial Consult  Pharmacy Consult for Heparin  Indication: chest pain/ACS  No Known Allergies  Patient Measurements: Height: 6' (182.9 cm) Weight: 162 lb 14.4 oz (73.9 kg) IBW/kg (Calculated) : 77.6 Heparin Dosing Weight: 73.9 kg   Vital Signs: Temp: 98 F (36.7 C) (02/10 1234) Temp Source: Oral (02/10 1234) BP: 177/96 (02/10 1234) Pulse Rate: 81 (02/10 1234)  Labs:  Recent Labs  03/28/16 0006 03/28/16 0452 03/28/16 1038 03/28/16 1628  HGB 13.2  --   --   --   HCT 40.4  --   --   --   PLT 235  --   --   --   CREATININE 2.07*  --   --   --   TROPONINI 0.07* 0.15* 1.12* 1.92*    Estimated Creatinine Clearance: 34.2 mL/min (by C-G formula based on SCr of 2.07 mg/dL (H)).   Medical History: Past Medical History:  Diagnosis Date  . CHF (congestive heart failure) ([place])   . DM (diabetes mellitus), type 2 ([place])   . HTN (hypertension)     Medications:  Prescriptions Prior to Admission  Medication Sig Dispense Refill Last Dose  . albuterol (PROVENTIL HFA;VENTOLIN HFA) 108 (90 Base) MCG/ACT inhaler Inhale 1-2 puffs into the lungs every 6 (six) hours as needed for wheezing or shortness of breath.     . alfuzosin (UROXATRAL) 10 MG 24 hr tablet Take 10 mg by mouth at bedtime.   03/27/2016 at Unknown time  . enalapril (VASOTEC) 20 MG tablet Take 20 mg by mouth 2 (two) times daily.   03/27/2016 at Unknown time  . furosemide (LASIX) 20 MG tablet Take 20 mg by mouth daily.   03/27/2016 at Unknown time  . hydrALAZINE (APRESOLINE) 100 MG tablet Take 100 mg by mouth 3 (three) times daily.   03/27/2016 at Unknown time  . insulin glargine (LANTUS) 100 UNIT/ML injection Inject 15 Units into the skin at bedtime.   03/27/2016 at Unknown time  . metFORMIN (GLUCOPHAGE) 1000 MG tablet Take 1,000-1,500 mg by mouth 2 (two) times daily with a meal. 1000mg  in the morning and 1500 mg in the evening   03/27/2016 at Unknown time  . omeprazole (PRILOSEC) 20 MG capsule Take 20 mg  by mouth 2 (two) times daily before a meal.   03/27/2016 at Unknown time  . pravastatin (PRAVACHOL) 40 MG tablet Take 40 mg by mouth daily.   03/27/2016 at Unknown time  . vitamin B-12 (CYANOCOBALAMIN) 1000 MCG tablet Take 1,000 mcg by mouth daily.   03/27/2016 at Unknown time    Assessment: Pharmacy consulted to dose heparin in this 71 year old male with NSTEMI/ACS.  Pt was on heparin 5000 units SQ Q8H previously.  CrCl = 34.2 ml/min  Goal of Therapy:  Heparin level 0.3-0.7 units/ml Monitor platelets by anticoagulation protocol: Yes   Plan:  Start heparin infusion at 900 units/hr Check anti-Xa level in 8 hours and daily while on heparin Continue to monitor H&H and platelets  Colton Thompson D 03/28/2016,6:03 PM

## 2016-03-28 NOTE — ED Notes (Signed)
XR at bedside

## 2016-03-28 NOTE — Progress Notes (Signed)
Patient was hurting and could not void. Bladder scan showed 637ml in bladder. Inserted foley catheter and 733ml of urine was returned. Patient feels much better.

## 2016-03-28 NOTE — ED Triage Notes (Signed)
Pt from home with resp distress. Pt arrives on bipap. Emesis x1 per ems./ pt with 92% pox on 4lpm via [place] per ems.

## 2016-03-29 DIAGNOSIS — I255 Ischemic cardiomyopathy: Secondary | ICD-10-CM

## 2016-03-29 DIAGNOSIS — N171 Acute kidney failure with acute cortical necrosis: Secondary | ICD-10-CM

## 2016-03-29 LAB — BASIC METABOLIC PANEL
Anion gap: 6 (ref 5–15)
BUN: 26 mg/dL — ABNORMAL HIGH (ref 6–20)
CALCIUM: 8 mg/dL — AB (ref 8.9–10.3)
CO2: 26 mmol/L (ref 22–32)
Chloride: 103 mmol/L (ref 101–111)
Creatinine, Ser: 2.61 mg/dL — ABNORMAL HIGH (ref 0.61–1.24)
GFR calc non Af Amer: 23 mL/min — ABNORMAL LOW (ref >60)
GFR, EST AFRICAN AMERICAN: 27 mL/min — AB (ref >60)
GLUCOSE: 157 mg/dL — AB (ref 65–99)
Potassium: 4 mmol/L (ref 3.5–5.1)
Sodium: 135 mmol/L (ref 135–145)

## 2016-03-29 LAB — HEPARIN LEVEL (UNFRACTIONATED)
HEPARIN UNFRACTIONATED: 0.36 [IU]/mL (ref 0.30–0.70)
Heparin Unfractionated: 0.25 IU/mL — ABNORMAL LOW (ref 0.30–0.70)
Heparin Unfractionated: 0.4 IU/mL (ref 0.30–0.70)

## 2016-03-29 LAB — GLUCOSE, CAPILLARY
Glucose-Capillary: 125 mg/dL — ABNORMAL HIGH (ref 65–99)
Glucose-Capillary: 245 mg/dL — ABNORMAL HIGH (ref 65–99)
Glucose-Capillary: 136 mg/dL — ABNORMAL HIGH (ref 65–99)
Glucose-Capillary: 162 mg/dL — ABNORMAL HIGH (ref 65–99)

## 2016-03-29 LAB — HEMOGLOBIN A1C
Hgb A1c MFr Bld: 5.9 % — ABNORMAL HIGH (ref 4.8–5.6)
Mean Plasma Glucose: 123 mg/dL

## 2016-03-29 MED ORDER — CARVEDILOL 3.125 MG PO TABS
3.1250 mg | ORAL_TABLET | Freq: Two times a day (BID) | ORAL | Status: DC
  Administered 2016-03-30 – 2016-03-31 (×3): 3.125 mg via ORAL
  Filled 2016-03-29 (×3): qty 1

## 2016-03-29 MED ORDER — HEPARIN BOLUS VIA INFUSION
1100.0000 [IU] | Freq: Once | INTRAVENOUS | Status: AC
Start: 2016-03-29 — End: 2016-03-29
  Administered 2016-03-29: 1100 [IU] via INTRAVENOUS
  Filled 2016-03-29: qty 1100

## 2016-03-29 NOTE — Progress Notes (Signed)
[place] at [place] NAME: Colton Thompson    MR#:  [id number]  DATE OF BIRTH:  1945-07-15  SUBJECTIVE:  CHIEF COMPLAINT:   Chief Complaint  Patient presents with  . Respiratory Distress   Feels better NO CP  On 1-2 L O2 REVIEW OF SYSTEMS:    Review of Systems  Constitutional: Positive for malaise/fatigue. Negative for chills and fever.  HENT: Negative for sore throat.   Eyes: Negative for blurred vision, double vision and pain.  Respiratory: Positive for cough, sputum production and shortness of breath. Negative for hemoptysis and wheezing.   Cardiovascular: Negative for chest pain, palpitations, orthopnea and leg swelling.  Gastrointestinal: Negative for abdominal pain, constipation, diarrhea, heartburn, nausea and vomiting.  Genitourinary: Negative for dysuria and hematuria.  Musculoskeletal: Negative for back pain and joint pain.  Skin: Negative for rash.  Neurological: Positive for weakness. Negative for sensory change, speech change, focal weakness and headaches.  Endo/Heme/Allergies: Does not bruise/bleed easily.  Psychiatric/Behavioral: Negative for depression. The patient is not nervous/anxious.     DRUG ALLERGIES:  No Known Allergies  VITALS:  Blood pressure (!) 155/70, pulse 77, temperature 98.1 F (36.7 C), temperature source Oral, resp. rate 18, height 6' (1.829 m), weight 74.9 kg (165 lb 1.6 oz), SpO2 95 %.  PHYSICAL EXAMINATION:   Physical Exam  GENERAL:  71 y.o.-year-old patient lying in the bed with no acute distress.  EYES: Pupils equal, round, reactive to light and accommodation. No scleral icterus. Extraocular muscles intact.  HEENT: Head atraumatic, normocephalic. Oropharynx and nasopharynx clear.  NECK:  Supple, no jugular venous distention. No thyroid enlargement, no tenderness.  LUNGS: Normal WOB. Bibasilar crackles CARDIOVASCULAR: S1, S2 normal. No murmurs, rubs, or gallops.  ABDOMEN: Soft, nontender,  nondistended. Bowel sounds present. No organomegaly or mass.  EXTREMITIES: No cyanosis, clubbing or edema b/l.    NEUROLOGIC: Cranial nerves II through XII are intact. No focal Motor or sensory deficits b/l.   PSYCHIATRIC: The patient is alert and oriented x 3.  SKIN: No obvious rash, lesion, or ulcer.   LABORATORY PANEL:   CBC  Recent Labs Lab 03/28/16 0006  WBC 7.5  HGB 13.2  HCT 40.4  PLT 235   ------------------------------------------------------------------------------------------------------------------ Chemistries   Recent Labs Lab 03/28/16 0006  NA 135  K 4.3  CL 103  CO2 24  GLUCOSE 187*  BUN 22*  CREATININE 2.07*  CALCIUM 8.4*  AST 23  ALT 12*  ALKPHOS 56  BILITOT 0.6   ------------------------------------------------------------------------------------------------------------------  Cardiac Enzymes  Recent Labs Lab 03/28/16 1628  TROPONINI 1.92*   ------------------------------------------------------------------------------------------------------------------  RADIOLOGY:  Dg Chest 1 View  Result Date: 03/28/2016 CLINICAL DATA:  Respiratory distress.  Emesis. EXAM: CHEST 1 VIEW COMPARISON:  None. FINDINGS: Normal heart size and pulmonary vascularity. Diffuse perihilar peribronchial thickening with diffuse interstitial pattern to the lungs. This may represent interstitial pneumonia or edema. No blunting of costophrenic angles. No pneumothorax. Mediastinal contours appear intact. Calcification of the aorta. IMPRESSION: Peribronchial thickening with diffuse interstitial pattern to the lungs suggesting interstitial pneumonia or edema. Electronically Signed   By: Lucienne Capers M.D.   On: 03/28/2016 00:25     ASSESSMENT AND PLAN:   * Acute on chronic systolic chf with acute hypoxic resp failure - IV Lasix, Beta blockers - Input and Output - Counseled to limit fluids and Salt - Monitor Bun/Cr and Potassium - Echo - EF 25-30% -Cardiology follow up  after discharge  * Cardiomyopathy Likely ischemic  *  CKD3 Baseline unknown  * Elevated troponin Demand ischemia vs MI. He needs a cath eventually but timing is unclear due to CKD  * Accelerated HTN Improved with meds.  * DM2 SSI  * Tobacco use counselled  All the records are reviewed and case discussed with Care Management/Social Workerr. Management plans discussed with the patient, family and they are in agreement.  CODE STATUS: FULL CODE  DVT Prophylaxis: SCDs  TOTAL TIME TAKING CARE OF THIS PATIENT: 35 minutes.   POSSIBLE D/C IN 2-3 DAYS, DEPENDING ON CLINICAL CONDITION.  Hillary Bow R M.D on 03/29/2016 at 12:25 PM  Between 7am to 6pm - Pager - [phone]  After 6pm go to [email or web address] - password [email or web address] [facility]  Office  [phone]  CC: Primary care physician; Pcp Not In System  Note: This dictation was prepared with Dragon dictation along with smaller phrase technology. Any transcriptional errors that result from this process are unintentional.

## 2016-03-29 NOTE — Progress Notes (Signed)
ANTICOAGULATION CONSULT NOTE - Initial Consult  Pharmacy Consult for Heparin  Indication: chest pain/ACS  No Known Allergies  Patient Measurements: Height: 6' (182.9 cm) Weight: 165 lb 1.6 oz (74.9 kg) IBW/kg (Calculated) : 77.6 Heparin Dosing Weight: 73.9 kg   Vital Signs: Temp: 98.1 F (36.7 C) (02/11 0728) Temp Source: Oral (02/11 0728) BP: 155/70 (02/11 0728) Pulse Rate: 77 (02/11 0728)  Labs:  Recent Labs  03/28/16 0006 03/28/16 0452 03/28/16 1038 03/28/16 1628 03/29/16 0301 03/29/16 1150  HGB 13.2  --   --   --   --   --   HCT 40.4  --   --   --   --   --   PLT 235  --   --   --   --   --   HEPARINUNFRC  --   --   --   --  0.25* 0.36  CREATININE 2.07*  --   --   --   --  2.61*  TROPONINI 0.07* 0.15* 1.12* 1.92*  --   --     Estimated Creatinine Clearance: 27.5 mL/min (by C-G formula based on SCr of 2.61 mg/dL (H)).   Medical History: Past Medical History:  Diagnosis Date  . CHF (congestive heart failure) ([place])   . DM (diabetes mellitus), type 2 ([place])   . HTN (hypertension)     Medications:  Prescriptions Prior to Admission  Medication Sig Dispense Refill Last Dose  . albuterol (PROVENTIL HFA;VENTOLIN HFA) 108 (90 Base) MCG/ACT inhaler Inhale 1-2 puffs into the lungs every 6 (six) hours as needed for wheezing or shortness of breath.     . alfuzosin (UROXATRAL) 10 MG 24 hr tablet Take 10 mg by mouth at bedtime.   03/27/2016 at Unknown time  . enalapril (VASOTEC) 20 MG tablet Take 20 mg by mouth 2 (two) times daily.   03/27/2016 at Unknown time  . furosemide (LASIX) 20 MG tablet Take 20 mg by mouth daily.   03/27/2016 at Unknown time  . hydrALAZINE (APRESOLINE) 100 MG tablet Take 100 mg by mouth 3 (three) times daily.   03/27/2016 at Unknown time  . insulin glargine (LANTUS) 100 UNIT/ML injection Inject 15 Units into the skin at bedtime.   03/27/2016 at Unknown time  . metFORMIN (GLUCOPHAGE) 1000 MG tablet Take 1,000-1,500 mg by mouth 2 (two) times daily with a  meal. 1000mg  in the morning and 1500 mg in the evening   03/27/2016 at Unknown time  . omeprazole (PRILOSEC) 20 MG capsule Take 20 mg by mouth 2 (two) times daily before a meal.   03/27/2016 at Unknown time  . pravastatin (PRAVACHOL) 40 MG tablet Take 40 mg by mouth daily.   03/27/2016 at Unknown time  . vitamin B-12 (CYANOCOBALAMIN) 1000 MCG tablet Take 1,000 mcg by mouth daily.   03/27/2016 at Unknown time    Assessment: Pharmacy consulted to dose heparin in this 71 year old male with NSTEMI/ACS.  Pt was on heparin 5000 units SQ Q8H previously.  CrCl = 34.2 ml/min  Goal of Therapy:  Heparin level 0.3-0.7 units/ml Monitor platelets by anticoagulation protocol: Yes   Plan:  Start heparin infusion at 900 units/hr Check anti-Xa level in 8 hours and daily while on heparin Continue to monitor H&H and platelets   2/11 0300 heparin level 0.25. 1100 unit bolus and increase rate to 1050 units/hr. Recheck in 8 hours.  02/11 @ ~12:00 HL resulted @ 0.36. Will continue current heparin gtt rate. Will recheck heparin level  in 8 hours  Enos Muhl D 03/29/2016,1:09 PM

## 2016-03-29 NOTE — Progress Notes (Signed)
Patient has refused cpap for the night 

## 2016-03-29 NOTE — Progress Notes (Signed)
ANTICOAGULATION CONSULT NOTE - Initial Consult  Pharmacy Consult for Heparin  Indication: chest pain/ACS  No Known Allergies  Patient Measurements: Height: 6' (182.9 cm) Weight: 165 lb 1.6 oz (74.9 kg) IBW/kg (Calculated) : 77.6 Heparin Dosing Weight: 73.9 kg   Vital Signs: Temp: 98.5 F (36.9 C) (02/11 1950) Temp Source: Oral (02/11 1950) BP: 157/85 (02/11 1950) Pulse Rate: 82 (02/11 1950)  Labs:  Recent Labs  03/28/16 0006 03/28/16 0452 03/28/16 1038 03/28/16 1628 03/29/16 0301 03/29/16 1150 03/29/16 2050  HGB 13.2  --   --   --   --   --   --   HCT 40.4  --   --   --   --   --   --   PLT 235  --   --   --   --   --   --   HEPARINUNFRC  --   --   --   --  0.25* 0.36 0.40  CREATININE 2.07*  --   --   --   --  2.61*  --   TROPONINI 0.07* 0.15* 1.12* 1.92*  --   --   --     Estimated Creatinine Clearance: 27.5 mL/min (by C-G formula based on SCr of 2.61 mg/dL (H)).   Medical History: Past Medical History:  Diagnosis Date  . CHF (congestive heart failure) ([place])   . DM (diabetes mellitus), type 2 ([place])   . HTN (hypertension)     Medications:  Prescriptions Prior to Admission  Medication Sig Dispense Refill Last Dose  . albuterol (PROVENTIL HFA;VENTOLIN HFA) 108 (90 Base) MCG/ACT inhaler Inhale 1-2 puffs into the lungs every 6 (six) hours as needed for wheezing or shortness of breath.     . alfuzosin (UROXATRAL) 10 MG 24 hr tablet Take 10 mg by mouth at bedtime.   03/27/2016 at Unknown time  . enalapril (VASOTEC) 20 MG tablet Take 20 mg by mouth 2 (two) times daily.   03/27/2016 at Unknown time  . furosemide (LASIX) 20 MG tablet Take 20 mg by mouth daily.   03/27/2016 at Unknown time  . hydrALAZINE (APRESOLINE) 100 MG tablet Take 100 mg by mouth 3 (three) times daily.   03/27/2016 at Unknown time  . insulin glargine (LANTUS) 100 UNIT/ML injection Inject 15 Units into the skin at bedtime.   03/27/2016 at Unknown time  . metFORMIN (GLUCOPHAGE) 1000 MG tablet Take  1,000-1,500 mg by mouth 2 (two) times daily with a meal. 1000mg  in the morning and 1500 mg in the evening   03/27/2016 at Unknown time  . omeprazole (PRILOSEC) 20 MG capsule Take 20 mg by mouth 2 (two) times daily before a meal.   03/27/2016 at Unknown time  . pravastatin (PRAVACHOL) 40 MG tablet Take 40 mg by mouth daily.   03/27/2016 at Unknown time  . vitamin B-12 (CYANOCOBALAMIN) 1000 MCG tablet Take 1,000 mcg by mouth daily.   03/27/2016 at Unknown time    Assessment: Pharmacy consulted to dose heparin in this 71 year old male with NSTEMI/ACS.  Pt was on heparin 5000 units SQ Q8H previously.  CrCl = 34.2 ml/min  Goal of Therapy:  Heparin level 0.3-0.7 units/ml Monitor platelets by anticoagulation protocol: Yes   Plan:  Start heparin infusion at 900 units/hr Check anti-Xa level in 8 hours and daily while on heparin Continue to monitor H&H and platelets   2/11 0300 heparin level 0.25. 1100 unit bolus and increase rate to 1050 units/hr. Recheck in 8 hours.  02/11 @ ~12:00 HL resulted @ 0.36. Will continue current heparin gtt rate. Will recheck heparin level in 8 hours  02/11 HL @ 20:00 = 0.4. Will continue this pt on current rate of 1050 units/hr and recheck HL on 2/12 with AM labs.   Jonny Longino D 03/29/2016,9:22 PM

## 2016-03-29 NOTE — Progress Notes (Addendum)
Patient: Colton Thompson / Admit Date: 03/28/2016 / Date of Encounter: 03/29/2016, 6:10 PM   Hospital Problem List     Active Problems:   Acute on chronic combined systolic and diastolic CHF (congestive heart failure) ([place])   Acute renal failure superimposed on stage 3 chronic kidney disease ([place])   Dilated cardiomyopathy ([place])   Hypertensive urgency   Centrilobular emphysema (HCC)   Smoker   Pulmonary hypertension     Subjective   Reports his breathing is mildly improved, has not been ambulating Denies chest pain Discussed elevated troponin levels with him, implications Discussed climb in creatinine with him and implications Foley catheter in place, good urine Telemetry showing normal sinus rhythm  Inpatient Medications    Scheduled Meds: . alfuzosin  10 mg Oral QHS  . amLODipine  5 mg Oral BID  . docusate sodium  100 mg Oral BID  . furosemide  60 mg Intravenous BID  . hydrALAZINE  100 mg Oral TID  . insulin aspart  0-15 Units Subcutaneous TID WC  . insulin aspart  0-5 Units Subcutaneous QHS  . isosorbide mononitrate  30 mg Oral Daily  . pantoprazole  80 mg Oral Daily  . pravastatin  40 mg Oral Daily  . sodium chloride flush  3 mL Intravenous Q12H  . tamsulosin  0.4 mg Oral Daily  . vitamin B-12  1,000 mcg Oral Daily   Continuous Infusions: . heparin 1,050 Units/hr (03/29/16 1541)   PRN Meds: acetaminophen **OR** acetaminophen, ondansetron **OR** ondansetron (ZOFRAN) IV   Objective: Telemetry: Normal sinus rhythm Physical Exam: Blood pressure (!) 155/70, pulse 77, temperature 98.1 F (36.7 C), temperature source Oral, resp. rate 18, height 6' (1.829 m), weight 165 lb 1.6 oz (74.9 kg), SpO2 95 %. Body mass index is 22.39 kg/m. GEN: Well nourished, well developed, in no acute distress. Foley in place  HEENT: Grossly normal.  Neck: Supple, no JVD, carotid bruits, or masses. Cardiac: RRR, no murmurs, rubs, or gallops. No clubbing, cyanosis, edema.   Radials/DP/PT 2+ and equal bilaterally.  Respiratory:  Respirations regular and unlabored, clear to auscultation bilaterally. GI: Soft, nontender, nondistended, BS + x 4. MS: no deformity or atrophy. Skin: warm and dry, no rash. Neuro:  Strength and sensation are intact. Psych: AAOx3.  Normal affect.   Intake/Output Summary (Last 24 hours) at 03/29/16 1810 Last data filed at 03/29/16 1457  Gross per 24 hour  Intake              480 ml  Output             1875 ml  Net            -1395 ml     Labs: CBC  Recent Labs  03/28/16 0006  WBC 7.5  NEUTROABS 4.8  HGB 13.2  HCT 40.4  MCV 89.3  PLT 376   Basic Metabolic Panel  Recent Labs  03/28/16 0006 03/29/16 1150  NA 135 135  K 4.3 4.0  CL 103 103  CO2 24 26  GLUCOSE 187* 157*  BUN 22* 26*  CREATININE 2.07* 2.61*  CALCIUM 8.4* 8.0*   Liver Function Tests  Recent Labs  03/28/16 0006  AST 23  ALT 12*  ALKPHOS 56  BILITOT 0.6  PROT 6.6  ALBUMIN 3.6   No results for input(s): LIPASE, AMYLASE in the last 72 hours. Cardiac Enzymes  Recent Labs  03/28/16 0452 03/28/16 1038 03/28/16 1628  TROPONINI 0.15* 1.12* 1.92*   BNP  Invalid input(s): POCBNP D-Dimer No results for input(s): DDIMER in the last 72 hours. Hemoglobin A1C  Recent Labs  03/28/16 0452  HGBA1C 5.9*   Fasting Lipid Panel No results for input(s): CHOL, HDL, LDLCALC, TRIG, CHOLHDL, LDLDIRECT in the last 72 hours. Thyroid Function Tests  Recent Labs  03/28/16 0452  TSH 2.607    Weights: Filed Weights   03/28/16 0006 03/28/16 0438 03/29/16 0348  Weight: 162 lb (73.5 kg) 162 lb 14.4 oz (73.9 kg) 165 lb 1.6 oz (74.9 kg)     Radiology/Studies:  Dg Chest 1 View  Result Date: 03/28/2016 CLINICAL DATA:  Respiratory distress.  Emesis. EXAM: CHEST 1 VIEW COMPARISON:  None. FINDINGS: Normal heart size and pulmonary vascularity. Diffuse perihilar peribronchial thickening with diffuse interstitial pattern to the lungs. This may represent  interstitial pneumonia or edema. No blunting of costophrenic angles. No pneumothorax. Mediastinal contours appear intact. Calcification of the aorta. IMPRESSION: Peribronchial thickening with diffuse interstitial pattern to the lungs suggesting interstitial pneumonia or edema. Electronically Signed   By: Lucienne Capers M.D.   On: 03/28/2016 00:25     Assessment and Plan  71 y.o. male   presents with acute shortness of breath, prior history of CHF per his report, managed at the [place] ,  renal failure creatinine 2.0 , ejection fraction 20 to 25% , long history of smoking who continues to smoke , chest x-ray concerning for edema, troponin up to 1.9 and the setting of severe hypertension on arrival 225/125   1) NSTEMI Given climb in troponin, 1.9 yesterday afternoon, likely secondary to underlying ischemia High suspicion of underlying coronary disease given 50 years of smoking, depressed ejection fraction 20-25% -Unable to pursue catheterization right away given creatinine 2.6 Long discussion with him and wife at the bedside concerning treatment options --Ideally we would like cardiac catheterization given severely depressed ejection fraction --Renal function may improve now that Foley catheter has been placed for output tract obstruction, possible hydronephrosis, possible ATN  --monitor renal function over the next 2 days,  could consider catheterization during the week if renal function improves  2) cardiomyopathy, ischemic  Suspected ischemic cardiomyopathy  long history of smoking, and elevated troponin, more concerning for ischemic cardiomyopathy, also in the setting of chronic severe hypertension Elevated troponin 1.9 We'll add low-dose carvedilol Aspirin, continue statin Potentially could wean up on isosorbide, wean off amlodipine  3) renal failure, stage III Suspect acute on chronic, possible ATN secondary to hydronephrosis given urine retention Now with Foley catheter Recommend we  trend his renal function  He may benefit from nephrology consult, renal ultrasound  4) acute on chronic systolic CHF Ejection fraction  20 to 25% BNP markedly elevated 1900 Currently on Lasix IV 60 mg twice a day Ideally needs cardiac catheterization, would monitor renal function for now as he does not have angina symptoms  5) hypertensive urgency 225/125 when he reached the floor per the notes  likely has underlying hypertensive heart disease  continue hydralazine,  isosorbide Recommend we hold enalapril given need for catheterization, elevated creatinine Carvedilol added Could wean off amlodipine, increase isosorbide  6) pulmonary hypertension Secondary to LV dysfunction, underlying lung disease, severe hypertension We'll need gentle diuresis At the time of left heart catheterization, would recommend right heart cath for pressures  Long discussion with patient and patient's wife concerning the above  Total encounter time more than 35 minutes  Greater than 50% was spent in counseling and coordination of care with the patient    Signed,  Esmond Plants, MD, Ph.D. [facility] 03/29/2016, 6:10 PM

## 2016-03-29 NOTE — Progress Notes (Signed)
ANTICOAGULATION CONSULT NOTE - Initial Consult  Pharmacy Consult for Heparin  Indication: chest pain/ACS  No Known Allergies  Patient Measurements: Height: 6' (182.9 cm) Weight: 162 lb 14.4 oz (73.9 kg) IBW/kg (Calculated) : 77.6 Heparin Dosing Weight: 73.9 kg   Vital Signs: Temp: 98.6 F (37 C) (02/10 1930) Temp Source: Oral (02/10 1930) BP: 156/62 (02/10 1930) Pulse Rate: 87 (02/10 1930)  Labs:  Recent Labs  03/28/16 0006 03/28/16 0452 03/28/16 1038 03/28/16 1628 03/29/16 0301  HGB 13.2  --   --   --   --   HCT 40.4  --   --   --   --   PLT 235  --   --   --   --   HEPARINUNFRC  --   --   --   --  0.25*  CREATININE 2.07*  --   --   --   --   TROPONINI 0.07* 0.15* 1.12* 1.92*  --     Estimated Creatinine Clearance: 34.2 mL/min (by C-G formula based on SCr of 2.07 mg/dL (H)).   Medical History: Past Medical History:  Diagnosis Date  . CHF (congestive heart failure) ([place])   . DM (diabetes mellitus), type 2 ([place])   . HTN (hypertension)     Medications:  Prescriptions Prior to Admission  Medication Sig Dispense Refill Last Dose  . albuterol (PROVENTIL HFA;VENTOLIN HFA) 108 (90 Base) MCG/ACT inhaler Inhale 1-2 puffs into the lungs every 6 (six) hours as needed for wheezing or shortness of breath.     . alfuzosin (UROXATRAL) 10 MG 24 hr tablet Take 10 mg by mouth at bedtime.   03/27/2016 at Unknown time  . enalapril (VASOTEC) 20 MG tablet Take 20 mg by mouth 2 (two) times daily.   03/27/2016 at Unknown time  . furosemide (LASIX) 20 MG tablet Take 20 mg by mouth daily.   03/27/2016 at Unknown time  . hydrALAZINE (APRESOLINE) 100 MG tablet Take 100 mg by mouth 3 (three) times daily.   03/27/2016 at Unknown time  . insulin glargine (LANTUS) 100 UNIT/ML injection Inject 15 Units into the skin at bedtime.   03/27/2016 at Unknown time  . metFORMIN (GLUCOPHAGE) 1000 MG tablet Take 1,000-1,500 mg by mouth 2 (two) times daily with a meal. 1000mg  in the morning and 1500 mg in the  evening   03/27/2016 at Unknown time  . omeprazole (PRILOSEC) 20 MG capsule Take 20 mg by mouth 2 (two) times daily before a meal.   03/27/2016 at Unknown time  . pravastatin (PRAVACHOL) 40 MG tablet Take 40 mg by mouth daily.   03/27/2016 at Unknown time  . vitamin B-12 (CYANOCOBALAMIN) 1000 MCG tablet Take 1,000 mcg by mouth daily.   03/27/2016 at Unknown time    Assessment: Pharmacy consulted to dose heparin in this 71 year old male with NSTEMI/ACS.  Pt was on heparin 5000 units SQ Q8H previously.  CrCl = 34.2 ml/min  Goal of Therapy:  Heparin level 0.3-0.7 units/ml Monitor platelets by anticoagulation protocol: Yes   Plan:  Start heparin infusion at 900 units/hr Check anti-Xa level in 8 hours and daily while on heparin Continue to monitor H&H and platelets   2/11 0300 heparin level 0.25. 1100 unit bolus and increase rate to 1050 units/hr. Recheck in 8 hours.  Michaelle Bottomley S 03/29/2016,3:47 AM

## 2016-03-30 ENCOUNTER — Inpatient Hospital Stay: Payer: Medicare Other

## 2016-03-30 LAB — GLUCOSE, CAPILLARY
Glucose-Capillary: 119 mg/dL — ABNORMAL HIGH (ref 65–99)
Glucose-Capillary: 181 mg/dL — ABNORMAL HIGH (ref 65–99)
Glucose-Capillary: 170 mg/dL — ABNORMAL HIGH (ref 65–99)
Glucose-Capillary: 143 mg/dL — ABNORMAL HIGH (ref 65–99)

## 2016-03-30 LAB — PROTEIN / CREATININE RATIO, URINE
Creatinine, Urine: 48 mg/dL
Protein Creatinine Ratio: 1.46 mg/mg{Cre} — ABNORMAL HIGH (ref 0.00–0.15)
Total Protein, Urine: 70 mg/dL

## 2016-03-30 LAB — BASIC METABOLIC PANEL
ANION GAP: 7 (ref 5–15)
BUN: 26 mg/dL — ABNORMAL HIGH (ref 6–20)
CALCIUM: 8.1 mg/dL — AB (ref 8.9–10.3)
CO2: 24 mmol/L (ref 22–32)
Chloride: 103 mmol/L (ref 101–111)
Creatinine, Ser: 2.69 mg/dL — ABNORMAL HIGH (ref 0.61–1.24)
GFR calc non Af Amer: 22 mL/min — ABNORMAL LOW (ref >60)
GFR, EST AFRICAN AMERICAN: 26 mL/min — AB (ref >60)
Glucose, Bld: 130 mg/dL — ABNORMAL HIGH (ref 65–99)
Potassium: 4.2 mmol/L (ref 3.5–5.1)
SODIUM: 134 mmol/L — AB (ref 135–145)

## 2016-03-30 LAB — HEPARIN LEVEL (UNFRACTIONATED): HEPARIN UNFRACTIONATED: 0.46 [IU]/mL (ref 0.30–0.70)

## 2016-03-30 MED ORDER — AMIODARONE HCL 200 MG PO TABS
400.0000 mg | ORAL_TABLET | Freq: Two times a day (BID) | ORAL | Status: DC
  Administered 2016-03-30 – 2016-04-03 (×9): 400 mg via ORAL
  Filled 2016-03-30 (×9): qty 2

## 2016-03-30 MED ORDER — ASPIRIN EC 81 MG PO TBEC
81.0000 mg | DELAYED_RELEASE_TABLET | Freq: Every day | ORAL | Status: DC
  Administered 2016-03-30 – 2016-04-04 (×6): 81 mg via ORAL
  Filled 2016-03-30 (×6): qty 1

## 2016-03-30 MED ORDER — FUROSEMIDE 40 MG PO TABS
40.0000 mg | ORAL_TABLET | Freq: Every day | ORAL | Status: DC
  Administered 2016-03-30: 40 mg via ORAL
  Filled 2016-03-30 (×2): qty 1

## 2016-03-30 MED ORDER — NEPRO/CARBSTEADY PO LIQD
237.0000 mL | Freq: Two times a day (BID) | ORAL | Status: DC
  Administered 2016-03-30 – 2016-04-04 (×9): 237 mL via ORAL

## 2016-03-30 MED ORDER — ISOSORBIDE MONONITRATE ER 60 MG PO TB24
60.0000 mg | ORAL_TABLET | Freq: Every day | ORAL | Status: DC
  Administered 2016-03-31 – 2016-04-03 (×4): 60 mg via ORAL
  Filled 2016-03-30 (×4): qty 1

## 2016-03-30 NOTE — Consult Note (Signed)
Date: 03/30/2016                  Patient Name:  Colton Thompson  MRN: [id number]  DOB: Jun 09, 1945  Age / Sex: 71 y.o., male         PCP: Pcp Not In System/ [place]                 Service Requesting Consult: Internal medicine                 Reason for Consult: ARF, CKD            History of Present Illness: Patient is a 71 y.o. male with medical problems of Long-standing type 2 diabetes with severe neuropathy, cataracts, hypertension, BPH, severe systolic congestive heart failure who was admitted to [facility] on 03/28/2016 for evaluation of respiratory distress.   Evaluation in the hospital has revealed that his EF is less than 20%. The coronary angiography is being contemplated but delayed due to renal failure. Patient states that he has knowledge of chronic kidney disease but does not know how severe. His admission creatinine is 2.07 with corresponding GFR of 35. During hospital course it has slightly worsened to 2.69 today. GFR corresponding is 26. He states that an in and out catheterization was done and subsequently a Foley catheter was placed. Currently he has blood-tinged urine in the Foley bag.  His wife reports that he has lost over 15 pounds in the last 1 year. Food does not taste right to him. All the things that he used to like do not taste good to him anymore. He has significant muscle wasting and reports feeling weak..  No fevers or chills. No nausea or vomiting. No blood in the urine prior to arrival to the hospital. No diarrhea or constipation.   Medications: Outpatient medications: Prescriptions Prior to Admission  Medication Sig Dispense Refill Last Dose  . albuterol (PROVENTIL HFA;VENTOLIN HFA) 108 (90 Base) MCG/ACT inhaler Inhale 1-2 puffs into the lungs every 6 (six) hours as needed for wheezing or shortness of breath.     . alfuzosin (UROXATRAL) 10 MG 24 hr tablet Take 10 mg by mouth at bedtime.   03/27/2016 at Unknown time  . enalapril (VASOTEC) 20 MG tablet Take  20 mg by mouth 2 (two) times daily.   03/27/2016 at Unknown time  . furosemide (LASIX) 20 MG tablet Take 20 mg by mouth daily.   03/27/2016 at Unknown time  . hydrALAZINE (APRESOLINE) 100 MG tablet Take 100 mg by mouth 3 (three) times daily.   03/27/2016 at Unknown time  . insulin glargine (LANTUS) 100 UNIT/ML injection Inject 15 Units into the skin at bedtime.   03/27/2016 at Unknown time  . metFORMIN (GLUCOPHAGE) 1000 MG tablet Take 1,000-1,500 mg by mouth 2 (two) times daily with a meal. 1000mg  in the morning and 1500 mg in the evening   03/27/2016 at Unknown time  . omeprazole (PRILOSEC) 20 MG capsule Take 20 mg by mouth 2 (two) times daily before a meal.   03/27/2016 at Unknown time  . pravastatin (PRAVACHOL) 40 MG tablet Take 40 mg by mouth daily.   03/27/2016 at Unknown time  . vitamin B-12 (CYANOCOBALAMIN) 1000 MCG tablet Take 1,000 mcg by mouth daily.   03/27/2016 at Unknown time    Current medications: Current Facility-Administered Medications  Medication Dose Route Frequency Provider Last Rate Last Dose  . acetaminophen (TYLENOL) tablet 650 mg  650 mg Oral Q6H PRN Norva Riffle  Marcille Blanco, MD       Or  . acetaminophen (TYLENOL) suppository 650 mg  650 mg Rectal Q6H PRN Harrie Foreman, MD      . alfuzosin (UROXATRAL) 24 hr tablet 10 mg  10 mg Oral QHS Harrie Foreman, MD   10 mg at 03/29/16 2119  . carvedilol (COREG) tablet 3.125 mg  3.125 mg Oral BID WC Minna Merritts, MD   3.125 mg at 03/30/16 0841  . docusate sodium (COLACE) capsule 100 mg  100 mg Oral BID Harrie Foreman, MD   100 mg at 03/30/16 1006  . furosemide (LASIX) tablet 40 mg  40 mg Oral Daily Srikar Sudini, MD   40 mg at 03/30/16 1007  . heparin ADULT infusion 100 units/mL (25000 units/224mL sodium chloride 0.45%)  1,050 Units/hr Intravenous Continuous Srikar Sudini, MD 10.5 mL/hr at 03/29/16 1541 1,050 Units/hr at 03/29/16 1541  . hydrALAZINE (APRESOLINE) tablet 100 mg  100 mg Oral TID Harrie Foreman, MD   100 mg at 03/30/16 1006   . insulin aspart (novoLOG) injection 0-15 Units  0-15 Units Subcutaneous TID WC Hillary Bow, MD   5 Units at 03/29/16 1505  . insulin aspart (novoLOG) injection 0-5 Units  0-5 Units Subcutaneous QHS Hillary Bow, MD   2 Units at 03/28/16 2226  . [START ON 03/31/2016] isosorbide mononitrate (IMDUR) 24 hr tablet 60 mg  60 mg Oral Daily Ryan M Dunn, PA-C      . ondansetron [facility]) tablet 4 mg  4 mg Oral Q6H PRN Harrie Foreman, MD       Or  . ondansetron [facility]) injection 4 mg  4 mg Intravenous Q6H PRN Harrie Foreman, MD      . pantoprazole (PROTONIX) EC tablet 80 mg  80 mg Oral Daily Harrie Foreman, MD   80 mg at 03/30/16 1006  . pravastatin (PRAVACHOL) tablet 40 mg  40 mg Oral Daily Harrie Foreman, MD   40 mg at 03/30/16 1006  . sodium chloride flush (NS) 0.9 % injection 3 mL  3 mL Intravenous Q12H Harrie Foreman, MD   3 mL at 03/30/16 1007  . tamsulosin (FLOMAX) capsule 0.4 mg  0.4 mg Oral Daily Srikar Sudini, MD   0.4 mg at 03/30/16 1007  . vitamin B-12 (CYANOCOBALAMIN) tablet 1,000 mcg  1,000 mcg Oral Daily Harrie Foreman, MD   1,000 mcg at 03/30/16 1006      Allergies: No Known Allergies    Past Medical History: Past Medical History:  Diagnosis Date  . CHF (congestive heart failure) ([place])   . DM (diabetes mellitus), type 2 ([place])   . HTN (hypertension)      Past Surgical History: Past Surgical History:  Procedure Laterality Date  . ABDOMINAL HERNIA REPAIR       Family History: Family History  Problem Relation Age of Onset  . Stroke Mother      Social History: Social History   Social History  . Marital status: Married    Spouse name: N/A  . Number of children: N/A  . Years of education: N/A   Occupational History  . Not on file.   Social History Main Topics  . Smoking status: Not on file  . Smokeless tobacco: Not on file  . Alcohol use Not on file  . Drug use: Unknown  . Sexual activity: Not on file   Other Topics Concern  . Not on  file   Social History Narrative  .  No narrative on file     Review of Systems: Gen: No fevers or chills. He does have 15 pound weight loss over the last year HEENT: History of cataract surgery. No complaints at present CV: Significant dyspnea on exertion. No leg edema. EF 20% by echo Resp: No cough or sputum production GI: Appetite is poor. Food doesn't taste good. No diarrhea or constipation. No blood in the stool. Patient has had colonoscopy many years ago GU : Foley catheter in place. Blood-tinged urine. History of BPH MS: Denies any complaints. Does have significant proximal and distal muscle wasting Derm:  No complaints Psych: No complaints Heme: No complaints Neuro: Neuropathy with numbness and tingling in his feet Endocrine. Long-standing diabetes  Vital Signs: Blood pressure (!) 157/72, pulse 81, temperature 98.2 F (36.8 C), temperature source Oral, resp. rate 18, height 6' (1.829 m), weight 74.6 kg (164 lb 6.4 oz), SpO2 93 %.   Intake/Output Summary (Last 24 hours) at 03/30/16 1206 Last data filed at 03/30/16 1025  Gross per 24 hour  Intake          1067.25 ml  Output             2700 ml  Net         -1632.75 ml    Weight trends: Filed Weights   03/28/16 0438 03/29/16 0348 03/30/16 0412  Weight: 73.9 kg (162 lb 14.4 oz) 74.9 kg (165 lb 1.6 oz) 74.6 kg (164 lb 6.4 oz)    Physical Exam: General: thin gentleman, laying in the bed   HEENT Anicteric, moist oral mucous membranes   Neck:  Supple, no masses   Lungs: Bilateral mild diffuse wheezing, mild basilar crackles   Heart::  No rub   Abdomen: Soft, nontender   Extremities:  No edema. Significant muscle wasting   Neurologic: Alert, oriented   Skin: No acute rashes   Access: None   Foley: Foley with blood-tinged urine        Lab results: Basic Metabolic Panel:  Recent Labs Lab 03/28/16 0006 03/29/16 1150 03/30/16 0645  NA 135 135 134*  K 4.3 4.0 4.2  CL 103 103 103  CO2 24 26 24   GLUCOSE 187*  157* 130*  BUN 22* 26* 26*  CREATININE 2.07* 2.61* 2.69*  CALCIUM 8.4* 8.0* 8.1*    Liver Function Tests:  Recent Labs Lab 03/28/16 0006  AST 23  ALT 12*  ALKPHOS 56  BILITOT 0.6  PROT 6.6  ALBUMIN 3.6   No results for input(s): LIPASE, AMYLASE in the last 168 hours. No results for input(s): AMMONIA in the last 168 hours.  CBC:  Recent Labs Lab 03/28/16 0006  WBC 7.5  NEUTROABS 4.8  HGB 13.2  HCT 40.4  MCV 89.3  PLT 235    Cardiac Enzymes:  Recent Labs Lab 03/28/16 1628  TROPONINI 1.92*    BNP: Invalid input(s): POCBNP  CBG:  Recent Labs Lab 03/29/16 0744 03/29/16 1500 03/29/16 1644 03/29/16 2126 03/30/16 0725  GLUCAP 125* 245* 136* 162* 119*    Microbiology: No results found for this or any previous visit (from the past 720 hour(s)).   Coagulation Studies: No results for input(s): LABPROT, INR in the last 72 hours.  Urinalysis: No results for input(s): COLORURINE, LABSPEC, PHURINE, GLUCOSEU, HGBUR, BILIRUBINUR, KETONESUR, PROTEINUR, UROBILINOGEN, NITRITE, LEUKOCYTESUR in the last 72 hours.  Invalid input(s): APPERANCEUR      Imaging: US Renal  Result Date: 03/30/2016 CLINICAL DATA:  Acute renal failure . EXAM: RENAL /  URINARY TRACT ULTRASOUND COMPLETE COMPARISON:  None. FINDINGS: Right Kidney: Length: 12.2 cm. Echogenicity within normal limits. No hydronephrosis visualized. Two simple cysts in the right kidney largest measures 2.2 cm. Left Kidney: Length: 12.8 cm. Echogenicity within normal limits. No hydronephrosis visualized. A 2 cm simple cyst is noted in the left kidney. Bladder: Appears normal for degree of bladder distention. Foley catheter is in place. IMPRESSION: No acute abnormality identified. Simple cysts noted both kidneys. No hydronephrosis or bladder distention. Foley catheter is in place. Electronically Signed   By: Marcello Moores  Register   On: 03/30/2016 09:57      Assessment & Plan: Pt is a 71 y.o. African-American male with   medical problems of Long-standing type 2 diabetes with severe neuropathy, cataracts, hypertension, BPH, severe systolic congestive heart failure who was admitted to [facility] on 03/28/2016 for evaluation of respiratory distress.   1. 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 Diabetes type 2 with chronic kidney disease and severe diabetic neuropathy 3. Chronic systolic congestive heart failure. EF 20% 4. Hypertension  Plan 1. 24 hr urine for Cr clearance. Given poor muscle mass patient's renal function is likely worse than estimated GFR of 26 With decreased appetite, dysguesia, wt loss of 15 lbs over last year I suspect he is near ESRD Will wait for 24 hr Cr Cl results - If close to 15 he may need dialysis this admission 2. Evaluate for secondary hyperparathyroidism. Obtain intact PTH level, phosphorus level 3. Check urine protein to creatinine ratio. 4. Check SPEP, UPEP  Will follow

## 2016-03-30 NOTE — Progress Notes (Signed)
Contacted by nursing 28 beats run of ventricular tachycardia Patient was asymptomatic Hemodynamically stable We will start oral amiodarone with close monitoring of telemetry If he has frequent episodes of nonsustained VT, we will start IV amiodarone  Signed, Esmond Plants, MD, Ph.D [facility]

## 2016-03-30 NOTE — Progress Notes (Signed)
Directed to pt's room by central tele. Pt had 28 beat run of v tach. Pt denied any pain, but did feel lightheaded. [id number]/18 144/64/ 96%on 2 litres n.c.drKarl Bales paged.

## 2016-03-30 NOTE — Care Management Important Message (Signed)
Important Message  Patient Details  Name: Colton Thompson MRN: [id number] Date of Birth: 1945/03/27   Medicare Important Message Given:  Yes Initial signed IM printed from Epic and given to patient.    Katrina Stack, RN 03/30/2016, 3:13 PM

## 2016-03-30 NOTE — Progress Notes (Signed)
A&O. Up with assist. Foley in place for urinary retention. IV heparin infusing. No s/s distress. SLept well through the night.

## 2016-03-30 NOTE — Care Management Note (Signed)
Case Management Note  Patient Details  Name: Colton Thompson MRN: [id number] Date of Birth: [birth year]  Action/Plan:  Patient admitted from home.  Prior to this episode of illness, independent in all his adls,; no issues accessing medical care or paying for meds.  He has a cpap machine at home for approx one year which admits to not wearing.  Discussed the rationale for the cpap and importance of using.  Current need for 02 is acute.  Discussed need for home 02 assessment during progression.  Referral to heart failure clinic and dietician.  Has the CHF education booklet in hand.  has scales at home  Expected Discharge Date:                  Expected Discharge Plan:    In-House Referral:     Discharge planning Services  CM Consult  Post Acute Care Choice:    Choice offered to:     DME Arranged:    DME Agency:     HH Arranged:    HH Agency:     Status of Service:     If discussed at [employer], dates discussed:    Additional Comments:  Katrina Stack, RN 03/30/2016, 3:07 PM

## 2016-03-30 NOTE — Progress Notes (Signed)
ANTICOAGULATION CONSULT NOTE - Initial Consult  Pharmacy Consult for Heparin  Indication: chest pain/ACS  No Known Allergies  Patient Measurements: Height: 6' (182.9 cm) Weight: 164 lb 6.4 oz (74.6 kg) IBW/kg (Calculated) : 77.6 Heparin Dosing Weight: 73.9 kg   Vital Signs: Temp: 98.2 F (36.8 C) (02/12 0412) Temp Source: Oral (02/12 0412) BP: 157/72 (02/12 0432) Pulse Rate: 81 (02/12 0432)  Labs:  Recent Labs  03/28/16 0006 03/28/16 0452 03/28/16 1038 03/28/16 1628  03/29/16 1150 03/29/16 2050 03/30/16 0645  HGB 13.2  --   --   --   --   --   --   --   HCT 40.4  --   --   --   --   --   --   --   PLT 235  --   --   --   --   --   --   --   HEPARINUNFRC  --   --   --   --   < > 0.36 0.40 0.46  CREATININE 2.07*  --   --   --   --  2.61*  --   --   TROPONINI 0.07* 0.15* 1.12* 1.92*  --   --   --   --   < > = values in this interval not displayed.  Estimated Creatinine Clearance: 27.4 mL/min (by C-G formula based on SCr of 2.61 mg/dL (H)).   Medical History: Past Medical History:  Diagnosis Date  . CHF (congestive heart failure) ([place])   . DM (diabetes mellitus), type 2 ([place])   . HTN (hypertension)     Medications:  Prescriptions Prior to Admission  Medication Sig Dispense Refill Last Dose  . albuterol (PROVENTIL HFA;VENTOLIN HFA) 108 (90 Base) MCG/ACT inhaler Inhale 1-2 puffs into the lungs every 6 (six) hours as needed for wheezing or shortness of breath.     . alfuzosin (UROXATRAL) 10 MG 24 hr tablet Take 10 mg by mouth at bedtime.   03/27/2016 at Unknown time  . enalapril (VASOTEC) 20 MG tablet Take 20 mg by mouth 2 (two) times daily.   03/27/2016 at Unknown time  . furosemide (LASIX) 20 MG tablet Take 20 mg by mouth daily.   03/27/2016 at Unknown time  . hydrALAZINE (APRESOLINE) 100 MG tablet Take 100 mg by mouth 3 (three) times daily.   03/27/2016 at Unknown time  . insulin glargine (LANTUS) 100 UNIT/ML injection Inject 15 Units into the skin at bedtime.   03/27/2016  at Unknown time  . metFORMIN (GLUCOPHAGE) 1000 MG tablet Take 1,000-1,500 mg by mouth 2 (two) times daily with a meal. 1000mg  in the morning and 1500 mg in the evening   03/27/2016 at Unknown time  . omeprazole (PRILOSEC) 20 MG capsule Take 20 mg by mouth 2 (two) times daily before a meal.   03/27/2016 at Unknown time  . pravastatin (PRAVACHOL) 40 MG tablet Take 40 mg by mouth daily.   03/27/2016 at Unknown time  . vitamin B-12 (CYANOCOBALAMIN) 1000 MCG tablet Take 1,000 mcg by mouth daily.   03/27/2016 at Unknown time    Assessment: Pharmacy consulted to dose heparin in this 71 year old male with NSTEMI/ACS.  Pt was on heparin 5000 units SQ Q8H previously.  CrCl = 34.2 ml/min  Goal of Therapy:  Heparin level 0.3-0.7 units/ml Monitor platelets by anticoagulation protocol: Yes   Plan:  Start heparin infusion at 900 units/hr Check anti-Xa level in 8 hours and daily while on  heparin Continue to monitor H&H and platelets   2/11 0300 heparin level 0.25. 1100 unit bolus and increase rate to 1050 units/hr. Recheck in 8 hours.  02/11 @ ~12:00 HL resulted @ 0.36. Will continue current heparin gtt rate. Will recheck heparin level in 8 hours  02/11 HL @ 20:00 = 0.40. Will continue this pt on current rate of 1050 units/hr and recheck HL on 2/12 with AM labs.   02/12 HL @0645 = 0.46. Level remains therapeutic. Will continue current rate of 1050 units/hr and recheck HL and CBC with AM labs.   Pernell Dupre, PharmD, BCPS Clinical Pharmacist 03/30/2016 7:24 AM

## 2016-03-30 NOTE — Progress Notes (Signed)
[place] at [place] NAME: Lanell Dubie    MR#:  [id number]  DATE OF BIRTH:  [birth year]  SUBJECTIVE:  CHIEF COMPLAINT:   Chief Complaint  Patient presents with  . Respiratory Distress   SOB better. No CP Afebrile  Foley in place REVIEW OF SYSTEMS:    Review of Systems  Constitutional: Positive for malaise/fatigue. Negative for chills and fever.  HENT: Negative for sore throat.   Eyes: Negative for blurred vision, double vision and pain.  Respiratory: Positive for cough, sputum production and shortness of breath. Negative for hemoptysis and wheezing.   Cardiovascular: Negative for chest pain, palpitations, orthopnea and leg swelling.  Gastrointestinal: Negative for abdominal pain, constipation, diarrhea, heartburn, nausea and vomiting.  Genitourinary: Negative for dysuria and hematuria.  Musculoskeletal: Negative for back pain and joint pain.  Skin: Negative for rash.  Neurological: Positive for weakness. Negative for sensory change, speech change, focal weakness and headaches.  Endo/Heme/Allergies: Does not bruise/bleed easily.  Psychiatric/Behavioral: Negative for depression. The patient is not nervous/anxious.     DRUG ALLERGIES:  No Known Allergies  VITALS:  Blood pressure (!) 156/76, pulse (!) 43, temperature 98.2 F (36.8 C), temperature source Oral, resp. rate 12, height 6' (1.829 m), weight 74.6 kg (164 lb 6.4 oz), SpO2 (!) 88 %.  PHYSICAL EXAMINATION:   Physical Exam  GENERAL:  71 y.o.-year-old patient lying in the bed with no acute distress.  EYES: Pupils equal, round, reactive to light and accommodation. No scleral icterus. Extraocular muscles intact.  HEENT: Head atraumatic, normocephalic. Oropharynx and nasopharynx clear.  NECK:  Supple, no jugular venous distention. No thyroid enlargement, no tenderness.  LUNGS: Normal WOB. Bibasilar crackles CARDIOVASCULAR: S1, S2 normal. No murmurs, rubs, or gallops.   ABDOMEN: Soft, nontender, nondistended. Bowel sounds present. No organomegaly or mass.  EXTREMITIES: No cyanosis, clubbing or edema b/l.    NEUROLOGIC: Cranial nerves II through XII are intact. No focal Motor or sensory deficits b/l.   PSYCHIATRIC: The patient is alert and oriented x 3.  SKIN: No obvious rash, lesion, or ulcer.   LABORATORY PANEL:   CBC  Recent Labs Lab 03/28/16 0006  WBC 7.5  HGB 13.2  HCT 40.4  PLT 235   ------------------------------------------------------------------------------------------------------------------ Chemistries   Recent Labs Lab 03/28/16 0006  03/30/16 0645  NA 135  < > 134*  K 4.3  < > 4.2  CL 103  < > 103  CO2 24  < > 24  GLUCOSE 187*  < > 130*  BUN 22*  < > 26*  CREATININE 2.07*  < > 2.69*  CALCIUM 8.4*  < > 8.1*  AST 23  --   --   ALT 12*  --   --   ALKPHOS 56  --   --   BILITOT 0.6  --   --   < > = values in this interval not displayed. ------------------------------------------------------------------------------------------------------------------  Cardiac Enzymes  Recent Labs Lab 03/28/16 1628  TROPONINI 1.92*   ------------------------------------------------------------------------------------------------------------------  RADIOLOGY:  US Renal  Result Date: 03/30/2016 CLINICAL DATA:  Acute renal failure . EXAM: RENAL / URINARY TRACT ULTRASOUND COMPLETE COMPARISON:  None. FINDINGS: Right Kidney: Length: 12.2 cm. Echogenicity within normal limits. No hydronephrosis visualized. Two simple cysts in the right kidney largest measures 2.2 cm. Left Kidney: Length: 12.8 cm. Echogenicity within normal limits. No hydronephrosis visualized. A 2 cm simple cyst is noted in the left kidney. Bladder: Appears normal for degree of bladder distention. Foley catheter  is in place. IMPRESSION: No acute abnormality identified. Simple cysts noted both kidneys. No hydronephrosis or bladder distention. Foley catheter is in place.  Electronically Signed   By: Marcello Moores  Register   On: 03/30/2016 09:57     ASSESSMENT AND PLAN:   * Acute on chronic systolic chf with acute hypoxic resp failure - IV Lasix, Beta blockers. Change to PO lasix today due to worsening Cr. - Input and Output - Counseled to limit fluids and Salt - Monitor Bun/Cr and Potassium - Echo - EF 25-30%  * Cardiomyopathy Likely ischemic  * AKI over CKD3 Baseline unknown  * Elevated troponin Demand ischemia vs MI. He needs a cath eventually but timing is unclear due to CKD Will need staged cath  * Accelerated HTN Improved with meds.  * DM2 SSI  * Tobacco use counseled previously  * urinary retention due to BPH ON flomax Foley in place  All the records are reviewed and case discussed with Care Management/Social Workerr. Management plans discussed with the patient, family and they are in agreement.  CODE STATUS: FULL CODE  DVT Prophylaxis: SCDs  TOTAL TIME TAKING CARE OF THIS PATIENT: 35 minutes.   POSSIBLE D/C IN 2-3 DAYS, DEPENDING ON CLINICAL CONDITION.  Hillary Bow R M.D on 03/30/2016 at 12:39 PM  Between 7am to 6pm - Pager - [phone]  After 6pm go to [email or web address] - password [email or web address] [place] [facility]  Office  [phone]  CC: Primary care physician; Pcp Not In System  Note: This dictation was prepared with Dragon dictation along with smaller phrase technology. Any transcriptional errors that result from this process are unintentional.

## 2016-03-30 NOTE — Progress Notes (Signed)
Patient Name: Colton Thompson Date of Encounter: 03/30/2016  Primary Cardiologist: New to [facility] - consult by Rockey Situ (followed by [facility] Problem List     Active Problems:   Acute on chronic combined systolic and diastolic CHF (congestive heart failure) (HCC)   Acute renal failure superimposed on stage 3 chronic kidney disease ([place])   Dilated cardiomyopathy ([place])   Hypertensive urgency   Centrilobular emphysema ([place])   Smoker   Pulmonary hypertension   Ischemic cardiomyopathy     Subjective   SOB improving. Renal function remains elevated at 2.69 this morning (prior 2.61 on 2/11). UOP 1.6 L for the past 24 hours and 3.6 L for the admission. Renal ultrasounds this morning negative. Renal to see today. No chest pain.   Inpatient Medications    Scheduled Meds: . alfuzosin  10 mg Oral QHS  . amLODipine  5 mg Oral BID  . carvedilol  3.125 mg Oral BID WC  . docusate sodium  100 mg Oral BID  . furosemide  40 mg Oral Daily  . hydrALAZINE  100 mg Oral TID  . insulin aspart  0-15 Units Subcutaneous TID WC  . insulin aspart  0-5 Units Subcutaneous QHS  . isosorbide mononitrate  30 mg Oral Daily  . pantoprazole  80 mg Oral Daily  . pravastatin  40 mg Oral Daily  . sodium chloride flush  3 mL Intravenous Q12H  . tamsulosin  0.4 mg Oral Daily  . vitamin B-12  1,000 mcg Oral Daily   Continuous Infusions: . heparin 1,050 Units/hr (03/29/16 1541)   PRN Meds: acetaminophen **OR** acetaminophen, ondansetron **OR** ondansetron (ZOFRAN) IV   Vital Signs    Vitals:   03/29/16 0728 03/29/16 1950 03/30/16 0412 03/30/16 0432  BP: (!) 155/70 (!) 157/85 (!) 168/78 (!) 157/72  Pulse: 77 82 (!) 38 81  Resp: 18 18 18    Temp: 98.1 F (36.7 C) 98.5 F (36.9 C) 98.2 F (36.8 C)   TempSrc: Oral Oral Oral   SpO2: 95% 95% 93%   Weight:   164 lb 6.4 oz (74.6 kg)   Height:        Intake/Output Summary (Last 24 hours) at 03/30/16 1043 Last data filed at 03/30/16 1025  Gross per 24 hour  Intake          1067.25 ml  Output             2700 ml  Net         -1632.75 ml   Filed Weights   03/28/16 0438 03/29/16 0348 03/30/16 0412  Weight: 162 lb 14.4 oz (73.9 kg) 165 lb 1.6 oz (74.9 kg) 164 lb 6.4 oz (74.6 kg)    Physical Exam    GEN: Well nourished, well developed, in no acute distress.  HEENT: Grossly normal.  Neck: Supple, no JVD, carotid bruits, or masses. Cardiac: RRR, no murmurs, rubs, or gallops. No clubbing, cyanosis, edema.  Radials/DP/PT 2+ and equal bilaterally.  Respiratory:  Bibasilar crackles. GI: Soft, nontender, nondistended, BS + x 4. MS: no deformity or atrophy. Skin: warm and dry, no rash. Neuro:  Strength and sensation are intact. Psych: AAOx3.  Normal affect.  Labs    CBC  Recent Labs  03/28/16 0006  WBC 7.5  NEUTROABS 4.8  HGB 13.2  HCT 40.4  MCV 89.3  PLT 932   Basic Metabolic Panel  Recent Labs  03/29/16 1150 03/30/16 0645  NA 135 134*  K 4.0 4.2  CL 103 103  CO2 26 24  GLUCOSE 157* 130*  BUN 26* 26*  CREATININE 2.61* 2.69*  CALCIUM 8.0* 8.1*   Liver Function Tests  Recent Labs  03/28/16 0006  AST 23  ALT 12*  ALKPHOS 56  BILITOT 0.6  PROT 6.6  ALBUMIN 3.6   No results for input(s): LIPASE, AMYLASE in the last 72 hours. Cardiac Enzymes  Recent Labs  03/28/16 0452 03/28/16 1038 03/28/16 1628  TROPONINI 0.15* 1.12* 1.92*   BNP Invalid input(s): POCBNP D-Dimer No results for input(s): DDIMER in the last 72 hours. Hemoglobin A1C  Recent Labs  03/28/16 0452  HGBA1C 5.9*   Fasting Lipid Panel No results for input(s): CHOL, HDL, LDLCALC, TRIG, CHOLHDL, LDLDIRECT in the last 72 hours. Thyroid Function Tests  Recent Labs  03/28/16 0452  TSH 2.607    Telemetry    Sinus rhythm, 80's to 90's - Personally Reviewed  ECG    n/a - Personally Reviewed  Radiology    US Renal  Result Date: 03/30/2016 IMPRESSION: No acute abnormality identified. Simple cysts noted both  kidneys. No hydronephrosis or bladder distention. Foley catheter is in place. Electronically Signed   By: Marcello Moores  Register   On: 03/30/2016 09:57    Cardiac Studies   Echo 03/28/16: Study Conclusions  - Left ventricle: The cavity size was moderately dilated. Systolic   function was severely reduced. The estimated ejection fraction   was in the range of 20% to 25%. Diffuse hypokinesis. Regional   wall motion abnormalities cannot be excluded. Findings consistent   with left ventricular diastolic dysfunction. - Mitral valve: There was mild to moderate regurgitation. - Left atrium: The atrium was mildly dilated. - Right ventricle: Systolic function was normal. - Tricuspid valve: There was mild-moderate regurgitation. - Pulmonary arteries: Systolic pressure was severely elevated. PA   peak pressure: 85 mm Hg (S).  Patient Profile     71 y.o. male with history of presents with acute shortness of breath, prior history of CHF per his report, managed at the [place] , renal failure with creatinine 2.0 , ejection fraction 20 to 25% , long history of smoking who continues to smoke, chest x-ray concerning for edema, troponin up to 1.9 and the setting of severe hypertension on arrival 225/125.   Assessment & Plan    1. NSTEMI: -Troponin peak of 1.9 -Chest pain free -On heparin gtt -High suspicion of CAD given 50 years of smoking with reduced EF of 20-25% -Have been unable to pursue cardiac cath to date given elevated renal function -Renal to see today -Consider staged cardiac cath with limited contrast this week pending renal function -For now, continue heparin gtt, Imdur, Coreg, and statin -Add aspirin 81 mg daily  2. Acute on chronic systolic CHF: -EF 54-56% -BNP elevated at 1900 -Continue Lasix with KCl repletion -Wean amlodipine given cardiomyopathy, titrate Imdur -Continue Coreg, Imdur/hydralazine  -Not on ACEi/ARB/spiro/Entresto given renal disease  3. CKD stage III: -Suspect acute  on chronic -Renal to see today  4. Hypertensive urgency: -BP  -Wean amlodipine and titrate Imdur  5. Pulmonary hypertension: -Will need R/LHC  -As above  Signed, Christell Faith, PA-C [place] Pager: [phone] 03/30/2016, 10:43 AM   Good diuresis overnight stable renal function creatinine 2.6 Reports he is not at his baseline in terms of his breathing  On clinical exam lungs with dullness at the bases, mildly decreased breath sounds throughout Heart sounds regular, no murmurs, abdomen soft nontender, no significant lower extremity edema  --  Case discussed with nephrology, Need to determine timing of cardiac catheterization/right heart catheter given his underlying renal dysfunction.  Severely elevated right heart pressures on echocardiogram We'll likely need continued diuresis for symptom relief Continue Lasix for now. We'll discuss timing of staged cardiac catheterization procedure with interventional cardiology   Total encounter time more than 25 minutes  Greater than 50% was spent in counseling and coordination of care with the patient  Signed, Esmond Plants, MD, Ph.D [facility]

## 2016-03-31 LAB — GLUCOSE, CAPILLARY
Glucose-Capillary: 164 mg/dL — ABNORMAL HIGH (ref 65–99)
Glucose-Capillary: 254 mg/dL — ABNORMAL HIGH (ref 65–99)
Glucose-Capillary: 118 mg/dL — ABNORMAL HIGH (ref 65–99)
Glucose-Capillary: 193 mg/dL — ABNORMAL HIGH (ref 65–99)

## 2016-03-31 LAB — CBC
HEMATOCRIT: 33.4 % — AB (ref 40.0–52.0)
Hemoglobin: 11.6 g/dL — ABNORMAL LOW (ref 13.0–18.0)
MCH: 30.4 pg (ref 26.0–34.0)
MCHC: 34.8 g/dL (ref 32.0–36.0)
MCV: 87.3 fL (ref 80.0–100.0)
Platelets: 199 10*3/uL (ref 150–440)
RBC: 3.82 MIL/uL — ABNORMAL LOW (ref 4.40–5.90)
RDW: 14.4 % (ref 11.5–14.5)
WBC: 5.9 10*3/uL (ref 3.8–10.6)

## 2016-03-31 LAB — RENAL FUNCTION PANEL
ANION GAP: 7 (ref 5–15)
Albumin: 2.9 g/dL — ABNORMAL LOW (ref 3.5–5.0)
BUN: 31 mg/dL — ABNORMAL HIGH (ref 6–20)
CHLORIDE: 101 mmol/L (ref 101–111)
CO2: 27 mmol/L (ref 22–32)
Calcium: 8.3 mg/dL — ABNORMAL LOW (ref 8.9–10.3)
Creatinine, Ser: 2.72 mg/dL — ABNORMAL HIGH (ref 0.61–1.24)
GFR calc non Af Amer: 22 mL/min — ABNORMAL LOW (ref >60)
GFR, EST AFRICAN AMERICAN: 25 mL/min — AB (ref >60)
Glucose, Bld: 156 mg/dL — ABNORMAL HIGH (ref 65–99)
Phosphorus: 3.9 mg/dL (ref 2.5–4.6)
Potassium: 4 mmol/L (ref 3.5–5.1)
Sodium: 135 mmol/L (ref 135–145)

## 2016-03-31 LAB — HEPARIN LEVEL (UNFRACTIONATED)
HEPARIN UNFRACTIONATED: 0.3 [IU]/mL (ref 0.30–0.70)
Heparin Unfractionated: 0.21 IU/mL — ABNORMAL LOW (ref 0.30–0.70)
Heparin Unfractionated: 0.33 IU/mL (ref 0.30–0.70)

## 2016-03-31 LAB — CREATININE CLEARANCE, URINE, 24 HOUR
COLLECTION INTERVAL-CRCL: 24 h
Creatinine Clearance: 34 mL/min — ABNORMAL LOW (ref 75–125)
Creatinine, 24H Ur: 1340 mg/d (ref 800–2000)
Creatinine, Urine: 80 mg/dL
URINE TOTAL VOLUME-CRCL: 1675 mL

## 2016-03-31 LAB — MAGNESIUM: Magnesium: 1.8 mg/dL (ref 1.7–2.4)

## 2016-03-31 MED ORDER — PANTOPRAZOLE SODIUM 40 MG PO TBEC
40.0000 mg | DELAYED_RELEASE_TABLET | Freq: Every day | ORAL | Status: DC
  Administered 2016-04-01 – 2016-04-04 (×4): 40 mg via ORAL
  Filled 2016-03-31 (×4): qty 1

## 2016-03-31 MED ORDER — DIPHENHYDRAMINE HCL 25 MG PO CAPS
25.0000 mg | ORAL_CAPSULE | Freq: Three times a day (TID) | ORAL | Status: DC | PRN
  Administered 2016-03-31 – 2016-04-01 (×2): 25 mg via ORAL
  Filled 2016-03-31 (×2): qty 1

## 2016-03-31 MED ORDER — CLONIDINE HCL 0.1 MG PO TABS
0.1000 mg | ORAL_TABLET | Freq: Two times a day (BID) | ORAL | Status: DC
  Administered 2016-03-31 – 2016-04-04 (×9): 0.1 mg via ORAL
  Filled 2016-03-31 (×9): qty 1

## 2016-03-31 MED ORDER — CARVEDILOL 6.25 MG PO TABS
6.2500 mg | ORAL_TABLET | Freq: Two times a day (BID) | ORAL | Status: DC
  Administered 2016-03-31 – 2016-04-02 (×4): 6.25 mg via ORAL
  Filled 2016-03-31 (×5): qty 1

## 2016-03-31 MED ORDER — HEPARIN BOLUS VIA INFUSION
1100.0000 [IU] | Freq: Once | INTRAVENOUS | Status: AC
  Administered 2016-03-31: 1100 [IU] via INTRAVENOUS
  Filled 2016-03-31: qty 1100

## 2016-03-31 NOTE — Progress Notes (Addendum)
[place] at [place] NAME: Colton Thompson    MR#:  [id number]  DATE OF BIRTH:  1945-05-04  SUBJECTIVE:  CHIEF COMPLAINT:   Chief Complaint  Patient presents with  . Respiratory Distress   No SOB. Feels weak Has foley in place REVIEW OF SYSTEMS:    Review of Systems  Constitutional: Positive for malaise/fatigue. Negative for chills and fever.  HENT: Negative for sore throat.   Eyes: Negative for blurred vision, double vision and pain.  Respiratory: Positive for cough, sputum production and shortness of breath. Negative for hemoptysis and wheezing.   Cardiovascular: Negative for chest pain, palpitations, orthopnea and leg swelling.  Gastrointestinal: Negative for abdominal pain, constipation, diarrhea, heartburn, nausea and vomiting.  Genitourinary: Negative for dysuria and hematuria.  Musculoskeletal: Negative for back pain and joint pain.  Skin: Negative for rash.  Neurological: Positive for weakness. Negative for sensory change, speech change, focal weakness and headaches.  Endo/Heme/Allergies: Does not bruise/bleed easily.  Psychiatric/Behavioral: Negative for depression. The patient is not nervous/anxious.     DRUG ALLERGIES:  No Known Allergies  VITALS:  Blood pressure (!) 143/63, pulse (!) 42, temperature 98.4 F (36.9 C), temperature source Oral, resp. rate 20, height 6' (1.829 m), weight 74 kg (163 lb 1.6 oz), SpO2 98 %.  PHYSICAL EXAMINATION:   Physical Exam  GENERAL:  71 y.o.-year-old patient lying in the bed with no acute distress.  EYES: Pupils equal, round, reactive to light and accommodation. No scleral icterus. Extraocular muscles intact.  HEENT: Head atraumatic, normocephalic. Oropharynx and nasopharynx clear.  NECK:  Supple, no jugular venous distention. No thyroid enlargement, no tenderness.  LUNGS: Normal WOB. Bibasilar crackles CARDIOVASCULAR: S1, S2 normal. No murmurs, rubs, or gallops.  ABDOMEN: Soft,  nontender, nondistended. Bowel sounds present. No organomegaly or mass.  EXTREMITIES: No cyanosis, clubbing or edema b/l.    NEUROLOGIC: Cranial nerves II through XII are intact. No focal Motor or sensory deficits b/l.   PSYCHIATRIC: The patient is alert and oriented x 3.  SKIN: No obvious rash, lesion, or ulcer.   LABORATORY PANEL:   CBC  Recent Labs Lab 03/31/16 0535  WBC 5.9  HGB 11.6*  HCT 33.4*  PLT 199   ------------------------------------------------------------------------------------------------------------------ Chemistries   Recent Labs Lab 03/28/16 0006  03/31/16 0535  NA 135  < > 135  K 4.3  < > 4.0  CL 103  < > 101  CO2 24  < > 27  GLUCOSE 187*  < > 156*  BUN 22*  < > 31*  CREATININE 2.07*  < > 2.72*  CALCIUM 8.4*  < > 8.3*  MG  --   --  1.8  AST 23  --   --   ALT 12*  --   --   ALKPHOS 56  --   --   BILITOT 0.6  --   --   < > = values in this interval not displayed. ------------------------------------------------------------------------------------------------------------------  Cardiac Enzymes  Recent Labs Lab 03/28/16 1628  TROPONINI 1.92*   ------------------------------------------------------------------------------------------------------------------  RADIOLOGY:  US Renal  Result Date: 03/30/2016 CLINICAL DATA:  Acute renal failure . EXAM: RENAL / URINARY TRACT ULTRASOUND COMPLETE COMPARISON:  None. FINDINGS: Right Kidney: Length: 12.2 cm. Echogenicity within normal limits. No hydronephrosis visualized. Two simple cysts in the right kidney largest measures 2.2 cm. Left Kidney: Length: 12.8 cm. Echogenicity within normal limits. No hydronephrosis visualized. A 2 cm simple cyst is noted in the left kidney. Bladder: Appears normal  for degree of bladder distention. Foley catheter is in place. IMPRESSION: No acute abnormality identified. Simple cysts noted both kidneys. No hydronephrosis or bladder distention. Foley catheter is in place.  Electronically Signed   By: Marcello Moores  Register   On: 03/30/2016 09:57     ASSESSMENT AND PLAN:   * Acute on chronic systolic chf with acute hypoxic resp failure - IV Lasix stopped due to ARF  Beta blockers. - Input and Output - Counseled to limit fluids and Salt - Monitor Bun/Cr and Potassium - Echo - EF 25-30%  * Cardiomyopathy Likely ischemic  * AKI over CKD 3 or 4 Baseline unknown  * Elevated troponin Demand ischemia vs MI. He needs a cath eventually but timing is unclear due to CKD Will need staged cath On Heparin drip  * NSVT ON amiodarone Will need Lifevest at discharge  * Accelerated HTN Improved with meds.  * DM2 SSI  * Tobacco use counseled previously  * Urinary retention due to BPH On flomax Foley in place  All the records are reviewed and case discussed with Care Management/Social Workerr. Management plans discussed with the patient, family and they are in agreement.  CODE STATUS: FULL CODE  DVT Prophylaxis: SCDs  TOTAL TIME TAKING CARE OF THIS PATIENT: 35 minutes.   POSSIBLE D/C IN 2-3 DAYS, DEPENDING ON CLINICAL CONDITION.  Hillary Bow R M.D on 03/31/2016 at 1:30 PM  Between 7am to 6pm - Pager - [phone]  After 6pm go to [email or web address] - password [email or web address] [place] [facility]  Office  [phone]  CC: Primary care physician; Pcp Not In System  Note: This dictation was prepared with Dragon dictation along with smaller phrase technology. Any transcriptional errors that result from this process are unintentional.

## 2016-03-31 NOTE — Progress Notes (Signed)
Patient Name: Colton Thompson Date of Encounter: 03/31/2016  Primary Cardiologist: New to [facility] - consult by Rockey Situ (followed by [facility] Problem List     Active Problems:   Acute on chronic combined systolic and diastolic CHF (congestive heart failure) (HCC)   Acute renal failure superimposed on stage 3 chronic kidney disease ([place])   Dilated cardiomyopathy ([place])   Hypertensive urgency   Centrilobular emphysema (HCC)   Smoker   Pulmonary hypertension   Ischemic cardiomyopathy     Subjective   Had 28 beats of NSVT in the afternoon of 2/12 with associated palpitations. Was started on amiodarone PO. Has another 11 beats of NSVT this morning at 6:22 AM with associated palpitations. Renal function stable, though elevated at 2.72 this morning. Minimal UOP noted. Has been seen by renal, currently performing 24 hour urine for Cr clearance.   Inpatient Medications    Scheduled Meds: . alfuzosin  10 mg Oral QHS  . amiodarone  400 mg Oral BID  . aspirin EC  81 mg Oral Daily  . carvedilol  3.125 mg Oral BID WC  . docusate sodium  100 mg Oral BID  . feeding supplement (NEPRO CARB STEADY)  237 mL Oral BID BM  . hydrALAZINE  100 mg Oral TID  . insulin aspart  0-15 Units Subcutaneous TID WC  . insulin aspart  0-5 Units Subcutaneous QHS  . isosorbide mononitrate  60 mg Oral Daily  . pantoprazole  80 mg Oral Daily  . pravastatin  40 mg Oral Daily  . sodium chloride flush  3 mL Intravenous Q12H  . tamsulosin  0.4 mg Oral Daily  . vitamin B-12  1,000 mcg Oral Daily   Continuous Infusions: . heparin 1,200 Units/hr (03/31/16 0704)   PRN Meds: acetaminophen **OR** acetaminophen, ondansetron **OR** ondansetron (ZOFRAN) IV   Vital Signs    Vitals:   03/30/16 1242 03/30/16 1911 03/31/16 0330 03/31/16 0734  BP: (!) 144/64 (!) 154/71 (!) 150/78 (!) 175/66  Pulse: 78 (!) 30 68 75  Resp: 16 18 18    Temp: 98 F (36.7 C) 98.8 F (37.1 C) 98.7 F (37.1 C) 98.2 F (36.8  C)  TempSrc: Oral Oral Oral Oral  SpO2: 96% 92% 90% 95%  Weight:   163 lb 1.6 oz (74 kg)   Height:        Intake/Output Summary (Last 24 hours) at 03/31/16 0932 Last data filed at 03/31/16 0735  Gross per 24 hour  Intake              564 ml  Output             1080 ml  Net             -516 ml   Filed Weights   03/29/16 0348 03/30/16 0412 03/31/16 0330  Weight: 165 lb 1.6 oz (74.9 kg) 164 lb 6.4 oz (74.6 kg) 163 lb 1.6 oz (74 kg)    Physical Exam    GEN: Well nourished, well developed, in no acute distress.  HEENT: Grossly normal.  Neck: Supple, no JVD, carotid bruits, or masses. Cardiac: RRR, no murmurs, rubs, or gallops. No clubbing, cyanosis, edema.  Radials/DP/PT 2+ and equal bilaterally.  Respiratory:  Respirations regular and unlabored, clear to auscultation bilaterally. GI: Soft, nontender, nondistended, BS + x 4. MS: no deformity or atrophy. Skin: warm and dry, no rash. Neuro:  Strength and sensation are intact. Psych: AAOx3.  Normal affect.  Labs  CBC  Recent Labs  03/31/16 0535  WBC 5.9  HGB 11.6*  HCT 33.4*  MCV 87.3  PLT 350   Basic Metabolic Panel  Recent Labs  03/30/16 0645 03/31/16 0535  NA 134* 135  K 4.2 4.0  CL 103 101  CO2 24 27  GLUCOSE 130* 156*  BUN 26* 31*  CREATININE 2.69* 2.72*  CALCIUM 8.1* 8.3*  MG  --  1.8  PHOS  --  3.9   Liver Function Tests  Recent Labs  03/31/16 0535  ALBUMIN 2.9*   No results for input(s): LIPASE, AMYLASE in the last 72 hours. Cardiac Enzymes  Recent Labs  03/28/16 1038 03/28/16 1628  TROPONINI 1.12* 1.92*   BNP Invalid input(s): POCBNP D-Dimer No results for input(s): DDIMER in the last 72 hours. Hemoglobin A1C No results for input(s): HGBA1C in the last 72 hours. Fasting Lipid Panel No results for input(s): CHOL, HDL, LDLCALC, TRIG, CHOLHDL, LDLDIRECT in the last 72 hours. Thyroid Function Tests No results for input(s): TSH, T4TOTAL, T3FREE, THYROIDAB in the last 72  hours.  Invalid input(s): FREET3  Telemetry    NSR, 70's frequent PVCs, 12 beats of NSVT at 6:22 AM, 28 beats of NSVT afternoon of 2/12 - Personally Reviewed  ECG    n/a - Personally Reviewed  Radiology    US Renal  Result Date: 03/30/2016 CLINICAL DATA:  Acute renal failure . EXAM: RENAL / URINARY TRACT ULTRASOUND COMPLETE COMPARISON:  None. FINDINGS: Right Kidney: Length: 12.2 cm. Echogenicity within normal limits. No hydronephrosis visualized. Two simple cysts in the right kidney largest measures 2.2 cm. Left Kidney: Length: 12.8 cm. Echogenicity within normal limits. No hydronephrosis visualized. A 2 cm simple cyst is noted in the left kidney. Bladder: Appears normal for degree of bladder distention. Foley catheter is in place. IMPRESSION: No acute abnormality identified. Simple cysts noted both kidneys. No hydronephrosis or bladder distention. Foley catheter is in place. Electronically Signed   By: Marcello Moores  Register   On: 03/30/2016 09:57    Cardiac Studies   Echo 03/28/16: Study Conclusions  - Left ventricle: The cavity size was moderately dilated. Systolic function was severely reduced. The estimated ejection fraction was in the range of 20% to 25%. Diffuse hypokinesis. Regional wall motion abnormalities cannot be excluded. Findings consistent with left ventricular diastolic dysfunction. - Mitral valve: There was mild to moderate regurgitation. - Left atrium: The atrium was mildly dilated. - Right ventricle: Systolic function was normal. - Tricuspid valve: There was mild-moderate regurgitation. - Pulmonary arteries: Systolic pressure was severely elevated. PA peak pressure: 85 mm Hg (S).  Patient Profile     71 y.o. male with acute shortness of breath, prior history of CHF per his report, managed at the [place] ,renal failure with creatinine 2.0 , ejection fraction 20 to 25% , long history of smoking who continues to smoke, chest x-ray concerning for edema, troponin  up to 1.9and the setting of severe hypertension on arrival 225/125.   Assessment & Plan    1. NSTEMI: -Troponin peak of 1.9 -Chest pain free -On heparin gtt -High suspicion of CAD given 50 years of smoking with reduced EF of 20-25% -Have been unable to pursue cardiac cath to date given elevated renal function that continues to worsen -Renal on board, awaiting results for 24 hour urine for creatinine clearance to determine need of possible dialysis  -Will await results from Renal prior to deciding about cardiac cath given worsening renal function -Consider staged cardiac cath with limited contrast  this week pending renal function -For now, continue heparin gtt, Imdur, Coreg, and statin -Aspirin 81 mg daily  2. Acute on chronic systolic CHF: -EF 46-04% -BNP elevated at 1900 -Lasix on hold given renal function -Amlodipine stopped given cardiomyopathy, titrate Imdur -Continue Coreg, Imdur/hydralazine  -Not on ACEi/ARB/spiro/Entresto given renal disease  3. Acute on CKD stage III: -Renal on board, await input from 24 hour urine  4. Hypertensive urgency: -BP poorly controlled this morning -Increase Coreg given BP and ventricular ectopy   5. Pulmonary hypertension: -Will need R/LHC  -As above  6. NSVT: -Continue PO amiodarone loading, if he has further ectopy may need to start IV amiodarone load -Increase Coreg as above -May need LifeVest prior to discharge given EF and ventricular ectopy  -Add magnesium oxide 400 mg daily  Signed, Marcille Blanco [facility] Pager: [phone] 03/31/2016, 9:32 AM

## 2016-03-31 NOTE — Progress Notes (Signed)
ANTICOAGULATION CONSULT NOTE - Initial Consult  Pharmacy Consult for Heparin  Indication: chest pain/ACS  No Known Allergies  Patient Measurements: Height: 6' (182.9 cm) Weight: 163 lb 1.6 oz (74 kg) IBW/kg (Calculated) : 77.6 Heparin Dosing Weight: 73.9 kg   Vital Signs: Temp: 98.7 F (37.1 C) (02/13 0330) Temp Source: Oral (02/13 0330) BP: 150/78 (02/13 0330) Pulse Rate: 68 (02/13 0330)  Labs:  Recent Labs  03/28/16 1038 03/28/16 1628  03/29/16 1150 03/29/16 2050 03/30/16 0645 03/31/16 0535  HGB  --   --   --   --   --   --  11.6*  HCT  --   --   --   --   --   --  33.4*  PLT  --   --   --   --   --   --  199  HEPARINUNFRC  --   --   < > 0.36 0.40 0.46 0.21*  CREATININE  --   --   --  2.61*  --  2.69* 2.72*  TROPONINI 1.12* 1.92*  --   --   --   --   --   < > = values in this interval not displayed.  Estimated Creatinine Clearance: 26.1 mL/min (by C-G formula based on SCr of 2.72 mg/dL (H)).   Medical History: Past Medical History:  Diagnosis Date  . CHF (congestive heart failure) ([place])   . DM (diabetes mellitus), type 2 ([place])   . HTN (hypertension)     Medications:  Prescriptions Prior to Admission  Medication Sig Dispense Refill Last Dose  . albuterol (PROVENTIL HFA;VENTOLIN HFA) 108 (90 Base) MCG/ACT inhaler Inhale 1-2 puffs into the lungs every 6 (six) hours as needed for wheezing or shortness of breath.     . alfuzosin (UROXATRAL) 10 MG 24 hr tablet Take 10 mg by mouth at bedtime.   03/27/2016 at Unknown time  . enalapril (VASOTEC) 20 MG tablet Take 20 mg by mouth 2 (two) times daily.   03/27/2016 at Unknown time  . furosemide (LASIX) 20 MG tablet Take 20 mg by mouth daily.   03/27/2016 at Unknown time  . hydrALAZINE (APRESOLINE) 100 MG tablet Take 100 mg by mouth 3 (three) times daily.   03/27/2016 at Unknown time  . insulin glargine (LANTUS) 100 UNIT/ML injection Inject 15 Units into the skin at bedtime.   03/27/2016 at Unknown time  . metFORMIN (GLUCOPHAGE)  1000 MG tablet Take 1,000-1,500 mg by mouth 2 (two) times daily with a meal. 1000mg  in the morning and 1500 mg in the evening   03/27/2016 at Unknown time  . omeprazole (PRILOSEC) 20 MG capsule Take 20 mg by mouth 2 (two) times daily before a meal.   03/27/2016 at Unknown time  . pravastatin (PRAVACHOL) 40 MG tablet Take 40 mg by mouth daily.   03/27/2016 at Unknown time  . vitamin B-12 (CYANOCOBALAMIN) 1000 MCG tablet Take 1,000 mcg by mouth daily.   03/27/2016 at Unknown time    Assessment: Pharmacy consulted to dose heparin in this 71 year old male with NSTEMI/ACS.  Pt was on heparin 5000 units SQ Q8H previously.  CrCl = 34.2 ml/min  Goal of Therapy:  Heparin level 0.3-0.7 units/ml Monitor platelets by anticoagulation protocol: Yes   Plan:  Start heparin infusion at 900 units/hr Check anti-Xa level in 8 hours and daily while on heparin Continue to monitor H&H and platelets   2/11 0300 heparin level 0.25. 1100 unit bolus and increase rate  to 1050 units/hr. Recheck in 8 hours.  02/11 @ ~12:00 HL resulted @ 0.36. Will continue current heparin gtt rate. Will recheck heparin level in 8 hours  02/11 HL @ 20:00 = 0.40. Will continue this pt on current rate of 1050 units/hr and recheck HL on 2/12 with AM labs.   02/12 HL @0645 = 0.46. Level remains therapeutic. Will continue current rate of 1050 units/hr and recheck HL and CBC with AM labs.   2/13 0535 HL subtherapeutic. 1100 units IV x 1 bolus and increase rate to 1200 units/hr. Will recheck HL in 8 hours.  Laural Benes, PharmD, BCPS Clinical Pharmacist 03/31/2016 6:56 AM

## 2016-03-31 NOTE — Plan of Care (Signed)
Problem: Limited Adherence to Nutrition-Related Recommendations (NB-1.6) Goal: Nutrition education Formal process to instruct or train a patient/client in a skill or to impart knowledge to help patients/clients voluntarily manage or modify food choices and eating behavior to maintain or improve health. Outcome: Completed/Met Date Met: 03/31/16 Nutrition Education Note  RD consulted for nutrition education regarding new onset CHF.  RD provided "Low Sodium Nutrition Therapy" handout from the Academy of [employer]. Reviewed patient's dietary recall. Provided examples on ways to decrease sodium intake in diet. Discouraged intake of processed foods and use of salt shaker. Encouraged fresh fruits and vegetables as well as whole grain sources of carbohydrates to maximize fiber intake.   RD discussed why it is important for patient to adhere to diet recommendations, and emphasized the role of fluids, foods to avoid, and importance of weighing self daily. Teach back method used.  Expect fair compliance.  Body mass index is 22.12 kg/m. Pt meets criteria for normal based on current BMI.  Current diet order is carb modified, patient is consuming approximately 75-100% of meals at this time. Labs and medications reviewed. No further nutrition interventions warranted at this time. RD contact information provided. If additional nutrition issues arise, please re-consult RD.   Colton Thompson. Tayten Heber, MS, RD LDN Inpatient Clinical Dietitian Pager [phone]

## 2016-03-31 NOTE — Progress Notes (Signed)
Subjective:  Patient is doing fair today. No nausea or vomiting. No shortness of breath Bedrest. Did not get up to sit on the chair He 4 hour urine for creatinine clearance is in progress   Objective:  Vital signs in last 24 hours:  Temp:  [98.2 F (36.8 C)-98.8 F (37.1 C)] 98.4 F (36.9 C) (02/13 1131) Pulse Rate:  [30-75] 42 (02/13 1131) Resp:  [18-20] 20 (02/13 1131) BP: (137-175)/(64-78) 137/64 (02/13 1131) SpO2:  [90 %-98 %] 98 % (02/13 1131) Weight:  [74 kg (163 lb 1.6 oz)] 74 kg (163 lb 1.6 oz) (02/13 0330)  Weight change: -0.59 kg (-1 lb 4.8 oz) Filed Weights   03/29/16 0348 03/30/16 0412 03/31/16 0330  Weight: 74.9 kg (165 lb 1.6 oz) 74.6 kg (164 lb 6.4 oz) 74 kg (163 lb 1.6 oz)    Intake/Output:    Intake/Output Summary (Last 24 hours) at 03/31/16 1243 Last data filed at 03/31/16 1214  Gross per 24 hour  Intake              324 ml  Output              650 ml  Net             -326 ml     Physical Exam: General: No acute distress, laying in the bed   HEENT Anicteric, moist oral mucous membranes   Neck Supple   Pulm/lungs Decreased breath sounds at bases   CVS/Heart Irregular, no rub   Abdomen:  Soft, nontender   Extremities: No edema, proximal and distal muscle wasting   Neurologic: Alert, oriented,   Skin: No acute rashes           Basic Metabolic Panel:   Recent Labs Lab 03/28/16 0006 03/29/16 1150 03/30/16 0645 03/31/16 0535  NA 135 135 134* 135  K 4.3 4.0 4.2 4.0  CL 103 103 103 101  CO2 24 26 24 27   GLUCOSE 187* 157* 130* 156*  BUN 22* 26* 26* 31*  CREATININE 2.07* 2.61* 2.69* 2.72*  CALCIUM 8.4* 8.0* 8.1* 8.3*  MG  --   --   --  1.8  PHOS  --   --   --  3.9     CBC:  Recent Labs Lab 03/28/16 0006 03/31/16 0535  WBC 7.5 5.9  NEUTROABS 4.8  --   HGB 13.2 11.6*  HCT 40.4 33.4*  MCV 89.3 87.3  PLT 235 199      Microbiology:  No results found for this or any previous visit (from the past 720  hour(s)).  Coagulation Studies: No results for input(s): LABPROT, INR in the last 72 hours.  Urinalysis: No results for input(s): COLORURINE, LABSPEC, PHURINE, GLUCOSEU, HGBUR, BILIRUBINUR, KETONESUR, PROTEINUR, UROBILINOGEN, NITRITE, LEUKOCYTESUR in the last 72 hours.  Invalid input(s): APPERANCEUR    Imaging: US Renal  Result Date: 03/30/2016 CLINICAL DATA:  Acute renal failure . EXAM: RENAL / URINARY TRACT ULTRASOUND COMPLETE COMPARISON:  None. FINDINGS: Right Kidney: Length: 12.2 cm. Echogenicity within normal limits. No hydronephrosis visualized. Two simple cysts in the right kidney largest measures 2.2 cm. Left Kidney: Length: 12.8 cm. Echogenicity within normal limits. No hydronephrosis visualized. A 2 cm simple cyst is noted in the left kidney. Bladder: Appears normal for degree of bladder distention. Foley catheter is in place. IMPRESSION: No acute abnormality identified. Simple cysts noted both kidneys. No hydronephrosis or bladder distention. Foley catheter is in place. Electronically Signed   By: Marcello Moores  Register  On: 03/30/2016 09:57     Medications:   . heparin 1,200 Units/hr (03/31/16 0704)   . alfuzosin  10 mg Oral QHS  . amiodarone  400 mg Oral BID  . aspirin EC  81 mg Oral Daily  . carvedilol  6.25 mg Oral BID WC  . docusate sodium  100 mg Oral BID  . feeding supplement (NEPRO CARB STEADY)  237 mL Oral BID BM  . hydrALAZINE  100 mg Oral TID  . insulin aspart  0-15 Units Subcutaneous TID WC  . insulin aspart  0-5 Units Subcutaneous QHS  . isosorbide mononitrate  60 mg Oral Daily  . pantoprazole  80 mg Oral Daily  . pravastatin  40 mg Oral Daily  . sodium chloride flush  3 mL Intravenous Q12H  . tamsulosin  0.4 mg Oral Daily  . vitamin B-12  1,000 mcg Oral Daily   acetaminophen **OR** acetaminophen, ondansetron **OR** ondansetron (ZOFRAN) IV  Assessment/ Plan:  71 y.o. African-American male with  medical problems of Long-standing type 2 diabetes with severe  neuropathy, cataracts, hypertension, BPH, severe systolic congestive heart failure who was admitted to [facility] on 03/28/2016 for evaluation of respiratory distress.   1. 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 Diabetes type 2 with chronic kidney disease and severe diabetic neuropathy 3. Chronic systolic congestive heart failure. EF 20% 4. Hypertension  Plan 1. 24 hr urine for Cr clearance. Given poor muscle mass patient's renal function is likely worse than estimated GFR of 26 With decreased appetite, dysguesia, wt loss of 15 lbs over last year I suspect he is near ESRD Will wait for 24 hr Cr Cl results - If close to 15 he may need dialysis this admission 2. intact PTH level pending. Phosphorus normal range 3. urine protein to creatinine ratio is 1.46 SPEP, UPEP pending   LOS: 3 Robson Trickey 2/13/201812:43 PM

## 2016-03-31 NOTE — Progress Notes (Addendum)
ANTICOAGULATION CONSULT NOTE - Initial Consult  Pharmacy Consult for Heparin  Indication: chest pain/ACS  No Known Allergies  Patient Measurements: Height: 6' (182.9 cm) Weight: 163 lb 1.6 oz (74 kg) IBW/kg (Calculated) : 77.6 Heparin Dosing Weight: 73.9 kg   Vital Signs: Temp: 98.4 F (36.9 C) (02/13 1131) Temp Source: Oral (02/13 1131) BP: 125/72 (02/13 1400) Pulse Rate: 42 (02/13 1131)  Labs:  Recent Labs  03/28/16 1628  03/29/16 1150  03/30/16 0645 03/31/16 0535 03/31/16 1452  HGB  --   --   --   --   --  11.6*  --   HCT  --   --   --   --   --  33.4*  --   PLT  --   --   --   --   --  199  --   HEPARINUNFRC  --   < > 0.36  < > 0.46 0.21* 0.30  CREATININE  --   --  2.61*  --  2.69* 2.72*  --   TROPONINI 1.92*  --   --   --   --   --   --   < > = values in this interval not displayed.  Estimated Creatinine Clearance: 26.1 mL/min (by C-G formula based on SCr of 2.72 mg/dL (H)).   Medical History: Past Medical History:  Diagnosis Date  . CHF (congestive heart failure) ([place])   . DM (diabetes mellitus), type 2 ([place])   . HTN (hypertension)     Medications:  Prescriptions Prior to Admission  Medication Sig Dispense Refill Last Dose  . albuterol (PROVENTIL HFA;VENTOLIN HFA) 108 (90 Base) MCG/ACT inhaler Inhale 1-2 puffs into the lungs every 6 (six) hours as needed for wheezing or shortness of breath.     . alfuzosin (UROXATRAL) 10 MG 24 hr tablet Take 10 mg by mouth at bedtime.   03/27/2016 at Unknown time  . enalapril (VASOTEC) 20 MG tablet Take 20 mg by mouth 2 (two) times daily.   03/27/2016 at Unknown time  . furosemide (LASIX) 20 MG tablet Take 20 mg by mouth daily.   03/27/2016 at Unknown time  . hydrALAZINE (APRESOLINE) 100 MG tablet Take 100 mg by mouth 3 (three) times daily.   03/27/2016 at Unknown time  . insulin glargine (LANTUS) 100 UNIT/ML injection Inject 15 Units into the skin at bedtime.   03/27/2016 at Unknown time  . metFORMIN (GLUCOPHAGE) 1000 MG tablet  Take 1,000-1,500 mg by mouth 2 (two) times daily with a meal. 1000mg  in the morning and 1500 mg in the evening   03/27/2016 at Unknown time  . omeprazole (PRILOSEC) 20 MG capsule Take 20 mg by mouth 2 (two) times daily before a meal.   03/27/2016 at Unknown time  . pravastatin (PRAVACHOL) 40 MG tablet Take 40 mg by mouth daily.   03/27/2016 at Unknown time  . vitamin B-12 (CYANOCOBALAMIN) 1000 MCG tablet Take 1,000 mcg by mouth daily.   03/27/2016 at Unknown time    Assessment: Pharmacy consulted to dose heparin in this 71 year old male with NSTEMI/ACS.  Pt was on heparin 5000 units SQ Q8H previously.  CrCl = 34.2 ml/min  Goal of Therapy:  Heparin level 0.3-0.7 units/ml Monitor platelets by anticoagulation protocol: Yes   Plan:  Start heparin infusion at 900 units/hr Check anti-Xa level in 8 hours and daily while on heparin Continue to monitor H&H and platelets   2/11 0300 heparin level 0.25. 1100 unit bolus and increase  rate to 1050 units/hr. Recheck in 8 hours.  02/11 @ ~12:00 HL resulted @ 0.36. Will continue current heparin gtt rate. Will recheck heparin level in 8 hours  02/11 HL @ 20:00 = 0.40. Will continue this pt on current rate of 1050 units/hr and recheck HL on 2/12 with AM labs.   02/12 HL @0645 = 0.46. Level remains therapeutic. Will continue current rate of 1050 units/hr and recheck HL and CBC with AM labs.   2/13 0535 HL subtherapeutic. 1100 units IV x 1 bolus and increase rate to 1200 units/hr. Will recheck HL in 8 hours.  [id number] 1452 HL =0.30 therapeutic. Continue drip at 1200 units/hr and check confirmation level in 8 hours  2/13 2257 HL therapeutic x 1. Continue current rate. Pharmacy will continue to follow level daily. Dayjah Selman A. [place], [place].D., BCPS  2/14 0451 HL therapeutic x 1. Continue current rate. Pharmacy will continue to follow level daily. Shanetta Nicolls A. [place], [place].D., BCPS  Ramond Dial, PharmD, BCPS Clinical Pharmacist 03/31/2016 3:40 PM

## 2016-03-31 NOTE — Progress Notes (Signed)
Notified by CCMD of 12 bt run of V-Tach. Checked on the patient.  No complaints of cp or sob. Will continue to monitor and assess.

## 2016-03-31 NOTE — Progress Notes (Signed)
Initial HF Clinic appointment scheduled on April 08, 2016 at 9:00am. Thank you for the referral.

## 2016-04-01 DIAGNOSIS — I472 Ventricular tachycardia, unspecified: Secondary | ICD-10-CM

## 2016-04-01 DIAGNOSIS — N184 Chronic kidney disease, stage 4 (severe): Secondary | ICD-10-CM

## 2016-04-01 DIAGNOSIS — N179 Acute kidney failure, unspecified: Secondary | ICD-10-CM

## 2016-04-01 LAB — BASIC METABOLIC PANEL
Anion gap: 5 (ref 5–15)
BUN: 34 mg/dL — ABNORMAL HIGH (ref 6–20)
CO2: 28 mmol/L (ref 22–32)
Calcium: 8.2 mg/dL — ABNORMAL LOW (ref 8.9–10.3)
Chloride: 102 mmol/L (ref 101–111)
Creatinine, Ser: 2.74 mg/dL — ABNORMAL HIGH (ref 0.61–1.24)
GFR calc Af Amer: 25 mL/min — ABNORMAL LOW (ref >60)
GFR calc non Af Amer: 22 mL/min — ABNORMAL LOW (ref >60)
Glucose, Bld: 160 mg/dL — ABNORMAL HIGH (ref 65–99)
Potassium: 3.8 mmol/L (ref 3.5–5.1)
Sodium: 135 mmol/L (ref 135–145)

## 2016-04-01 LAB — GLUCOSE, CAPILLARY
Glucose-Capillary: 133 mg/dL — ABNORMAL HIGH (ref 65–99)
Glucose-Capillary: 210 mg/dL — ABNORMAL HIGH (ref 65–99)
Glucose-Capillary: 166 mg/dL — ABNORMAL HIGH (ref 65–99)
Glucose-Capillary: 276 mg/dL — ABNORMAL HIGH (ref 65–99)

## 2016-04-01 LAB — CBC
HCT: 32.5 % — ABNORMAL LOW (ref 40.0–52.0)
Hemoglobin: 10.9 g/dL — ABNORMAL LOW (ref 13.0–18.0)
MCH: 29.7 pg (ref 26.0–34.0)
MCHC: 33.5 g/dL (ref 32.0–36.0)
MCV: 88.5 fL (ref 80.0–100.0)
PLATELETS: 172 10*3/uL (ref 150–440)
RBC: 3.67 MIL/uL — ABNORMAL LOW (ref 4.40–5.90)
RDW: 14.7 % — AB (ref 11.5–14.5)
WBC: 5.3 10*3/uL (ref 3.8–10.6)

## 2016-04-01 LAB — HEPATITIS B SURFACE ANTIGEN: Hepatitis B Surface Ag: NEGATIVE

## 2016-04-01 LAB — PARATHYROID HORMONE, INTACT (NO CA): PTH: 66 pg/mL — AB (ref 15–65)

## 2016-04-01 LAB — HEPARIN LEVEL (UNFRACTIONATED): HEPARIN UNFRACTIONATED: 0.44 [IU]/mL (ref 0.30–0.70)

## 2016-04-01 MED ORDER — SODIUM CHLORIDE 0.9% FLUSH: 3.0000 mL | INTRAVENOUS | Status: DC | PRN

## 2016-04-01 MED ORDER — SODIUM CHLORIDE 0.9 % IV SOLN: 250.0000 mL | INTRAVENOUS | Status: DC | PRN

## 2016-04-01 MED ORDER — MAGNESIUM OXIDE 400 (241.3 MG) MG PO TABS
400.0000 mg | ORAL_TABLET | Freq: Every day | ORAL | Status: DC
  Administered 2016-04-01 – 2016-04-04 (×4): 400 mg via ORAL
  Filled 2016-04-01 (×4): qty 1

## 2016-04-01 MED ORDER — HEPARIN SODIUM (PORCINE) 5000 UNIT/ML IJ SOLN
5000.0000 [IU] | Freq: Three times a day (TID) | INTRAMUSCULAR | Status: DC
  Administered 2016-04-01 – 2016-04-04 (×9): 5000 [IU] via SUBCUTANEOUS
  Filled 2016-04-01 (×9): qty 1

## 2016-04-01 MED ORDER — SODIUM CHLORIDE 0.9% FLUSH
3.0000 mL | Freq: Two times a day (BID) | INTRAVENOUS | Status: DC
  Administered 2016-04-01 – 2016-04-02 (×4): 3 mL via INTRAVENOUS

## 2016-04-01 MED ORDER — SODIUM CHLORIDE 0.9 % IV SOLN: INTRAVENOUS | Status: DC

## 2016-04-01 NOTE — Plan of Care (Signed)
Problem: Activity: Goal: Risk for activity intolerance will decrease Outcome: Not Progressing Patient is not ambulating in the room, encourage ambulation, sitting in recliner.

## 2016-04-01 NOTE — Plan of Care (Signed)
Problem: Nutrition: Goal: Adequate nutrition will be maintained Outcome: Progressing Patient had lost about 15 pounds over last 6 months.  Nutrition being supplemented with Nepro BID.

## 2016-04-01 NOTE — Progress Notes (Signed)
Subjective:  Patient continues to feel well with no nausea or vomiting. Eating well and drinking Nephro shakes.  Cr clearance at 34 UOP 475 cc yesterday. S Cr 2.74   Objective:  Vital signs in last 24 hours:  Temp:  [97.8 F (36.6 C)-98.4 F (36.9 C)] 98.3 F (36.8 C) (02/14 0900) Pulse Rate:  [34-60] 60 (02/14 0900) Resp:  [17-20] 18 (02/14 0900) BP: (123-152)/(60-75) 123/75 (02/14 0900) SpO2:  [91 %-98 %] 94 % (02/14 0900) Weight:  [72.7 kg (160 lb 4.8 oz)] 72.7 kg (160 lb 4.8 oz) (02/14 0425)  Weight change: -1.27 kg (-2 lb 12.8 oz) Filed Weights   03/30/16 0412 03/31/16 0330 04/01/16 0425  Weight: 74.6 kg (164 lb 6.4 oz) 74 kg (163 lb 1.6 oz) 72.7 kg (160 lb 4.8 oz)    Intake/Output:    Intake/Output Summary (Last 24 hours) at 04/01/16 1013 Last data filed at 04/01/16 1009  Gross per 24 hour  Intake            699.1 ml  Output              375 ml  Net            324.1 ml     Physical Exam: General: No acute distress, laying in the bed   HEENT Anicteric, moist oral mucous membranes   Neck Supple   Pulm/lungs Decreased breath sounds at bases   CVS/Heart Irregular, no rub   Abdomen:  Soft, nontender   Extremities: No edema, proximal and distal muscle wasting   Neurologic: Alert, oriented,   Skin: No acute rashes           Basic Metabolic Panel:   Recent Labs Lab 03/28/16 0006 03/29/16 1150 03/30/16 0645 03/31/16 0535 04/01/16 0451  NA 135 135 134* 135 135  K 4.3 4.0 4.2 4.0 3.8  CL 103 103 103 101 102  CO2 24 26 24 27 28   GLUCOSE 187* 157* 130* 156* 160*  BUN 22* 26* 26* 31* 34*  CREATININE 2.07* 2.61* 2.69* 2.72* 2.74*  CALCIUM 8.4* 8.0* 8.1* 8.3* 8.2*  MG  --   --   --  1.8  --   PHOS  --   --   --  3.9  --      CBC:  Recent Labs Lab 03/28/16 0006 03/31/16 0535 04/01/16 0451  WBC 7.5 5.9 5.3  NEUTROABS 4.8  --   --   HGB 13.2 11.6* 10.9*  HCT 40.4 33.4* 32.5*  MCV 89.3 87.3 88.5  PLT 235 199 172      Microbiology:  No  results found for this or any previous visit (from the past 720 hour(s)).  Coagulation Studies: No results for input(s): LABPROT, INR in the last 72 hours.  Urinalysis: No results for input(s): COLORURINE, LABSPEC, PHURINE, GLUCOSEU, HGBUR, BILIRUBINUR, KETONESUR, PROTEINUR, UROBILINOGEN, NITRITE, LEUKOCYTESUR in the last 72 hours.  Invalid input(s): APPERANCEUR    Imaging: No results found.   Medications:    . alfuzosin  10 mg Oral QHS  . amiodarone  400 mg Oral BID  . aspirin EC  81 mg Oral Daily  . carvedilol  6.25 mg Oral BID WC  . cloNIDine  0.1 mg Oral BID  . docusate sodium  100 mg Oral BID  . feeding supplement (NEPRO CARB STEADY)  237 mL Oral BID BM  . hydrALAZINE  100 mg Oral TID  . insulin aspart  0-15 Units Subcutaneous TID WC  . insulin  aspart  0-5 Units Subcutaneous QHS  . isosorbide mononitrate  60 mg Oral Daily  . magnesium oxide  400 mg Oral Daily  . pantoprazole  40 mg Oral Daily  . pravastatin  40 mg Oral Daily  . sodium chloride flush  3 mL Intravenous Q12H  . tamsulosin  0.4 mg Oral Daily  . vitamin B-12  1,000 mcg Oral Daily   acetaminophen **OR** acetaminophen, diphenhydrAMINE, ondansetron **OR** ondansetron (ZOFRAN) IV  Assessment/ Plan:  71 y.o. African-American male with  medical problems of Long-standing type 2 diabetes with severe neuropathy, cataracts, hypertension, BPH, severe systolic congestive heart failure who was admitted to [facility] on 03/28/2016 for evaluation of respiratory distress.   1. Chronic kidney disease stage 4 2. Diabetes type 2 with chronic kidney disease and severe diabetic neuropathy 3. Chronic systolic congestive heart failure. EF 20% 4. Hypertension 5. SHPTH  Plan 1. 24 hr urine for Cr clearance resulted in 34 cc/min 2. Discussed need for cardiac Cath and potential injury to the kidneys with iv contrast exposure. Discussed with Dr Rockey Situ. Staged procedure planned for minimal dye exposure 3. ANA, ANCA, SPEP, UPEP  pending   LOS: 4 Colton Thompson 2/14/201810:13 AM

## 2016-04-01 NOTE — Care Management (Signed)
Found in document that patient is followed by [facility].  There no refusal to transfer form in the patient chart.  The [facility] is on full diversion at present.  Will discuss transfer status with patient and his wife.

## 2016-04-01 NOTE — Progress Notes (Signed)
[place] [place] NAME: Shamell Suarez    MR#:  [id number]  DATE OF BIRTH:  06-12-1945  SUBJECTIVE:  CHIEF COMPLAINT:   Chief Complaint  Patient presents with  . Respiratory Distress   No SOB.  Foley in place Mild dry cough REVIEW OF SYSTEMS:    Review of Systems  Constitutional: Positive for malaise/fatigue. Negative for chills and fever.  HENT: Negative for sore throat.   Eyes: Negative for blurred vision, double vision and pain.  Respiratory: Positive for cough, sputum production and shortness of breath. Negative for hemoptysis and wheezing.   Cardiovascular: Negative for chest pain, palpitations, orthopnea and leg swelling.  Gastrointestinal: Negative for abdominal pain, constipation, diarrhea, heartburn, nausea and vomiting.  Genitourinary: Negative for dysuria and hematuria.  Musculoskeletal: Negative for back pain and joint pain.  Skin: Negative for rash.  Neurological: Positive for weakness. Negative for sensory change, speech change, focal weakness and headaches.  Endo/Heme/Allergies: Does not bruise/bleed easily.  Psychiatric/Behavioral: Negative for depression. The patient is not nervous/anxious.     DRUG ALLERGIES:  No Known Allergies  VITALS:  Blood pressure 123/75, pulse 60, temperature 98.3 F (36.8 C), temperature source Oral, resp. rate 18, height 6' (1.829 m), weight 72.7 kg (160 lb 4.8 oz), SpO2 94 %.  PHYSICAL EXAMINATION:   Physical Exam  GENERAL:  71 y.o.-year-old patient lying in the bed with no acute distress.  EYES: Pupils equal, round, reactive to light and accommodation. No scleral icterus. Extraocular muscles intact.  HEENT: Head atraumatic, normocephalic. Oropharynx and nasopharynx clear.  NECK:  Supple, no jugular venous distention. No thyroid enlargement, no tenderness.  LUNGS: Normal WOB. Bibasilar crackles CARDIOVASCULAR: S1, S2 normal. No murmurs, rubs, or gallops.  ABDOMEN: Soft,  nontender, nondistended. Bowel sounds present. No organomegaly or mass.  EXTREMITIES: No cyanosis, clubbing or edema b/l.    NEUROLOGIC: Cranial nerves II through XII are intact. No focal Motor or sensory deficits b/l.   PSYCHIATRIC: The patient is alert and oriented x 3.  SKIN: No obvious rash, lesion, or ulcer.   Foley catheter in place  LABORATORY PANEL:   CBC  Recent Labs Lab 04/01/16 0451  WBC 5.3  HGB 10.9*  HCT 32.5*  PLT 172   ------------------------------------------------------------------------------------------------------------------ Chemistries   Recent Labs Lab 03/28/16 0006  03/31/16 0535 04/01/16 0451  NA 135  < > 135 135  K 4.3  < > 4.0 3.8  CL 103  < > 101 102  CO2 24  < > 27 28  GLUCOSE 187*  < > 156* 160*  BUN 22*  < > 31* 34*  CREATININE 2.07*  < > 2.72* 2.74*  CALCIUM 8.4*  < > 8.3* 8.2*  MG  --   --  1.8  --   AST 23  --   --   --   ALT 12*  --   --   --   ALKPHOS 56  --   --   --   BILITOT 0.6  --   --   --   < > = values in this interval not displayed. ------------------------------------------------------------------------------------------------------------------  Cardiac Enzymes  Recent Labs Lab 03/28/16 1628  TROPONINI 1.92*   ------------------------------------------------------------------------------------------------------------------  RADIOLOGY:  No results found.   ASSESSMENT AND PLAN:   * Acute on chronic systolic chf with acute hypoxic resp failure - IV Lasix stopped due to ARF - Beta blockers. - Input and Output - Counseled to limit fluids and Salt - Monitor  Bun/Cr and Potassium - Echo - EF 20-25%  * Cardiomyopathy Likely ischemic  * AKI over CKD 3 or 4 Baseline unknown. 24 hour creatinine clearance is 34 Monitor after catheterization  * Elevated troponin Demand ischemia vs MI. With cardiomyopathy suspected to be ischemic he will need cardiac catheterization. Will need staged cath. Scheduled for  tomorrow. Continue Heparin drip  * NSVT On amiodarone Will need Lifevest at discharge. Discussed with [facility] representative.  * Accelerated HTN Improved with meds.  * DM2 SSI  * Tobacco use counseled previously  * Urinary retention due to BPH On flomax Foley in place  All the records are reviewed and case discussed with Care Management/Social Workerr. Management plans discussed with the patient, family and they are in agreement.  CODE STATUS: FULL CODE  DVT Prophylaxis: SCDs  TOTAL TIME TAKING CARE OF THIS PATIENT: 35 minutes.   POSSIBLE D/C IN 2-3 DAYS, DEPENDING ON CLINICAL CONDITION.  Hillary Bow R M.D on 04/01/2016 at 10:47 AM  Between 7am to 6pm - Pager - [phone]  After 6pm go to [email or web address] - password [email or web address] [facility]  Office  [phone]  CC: Primary care physician; Pcp Not In System  Note: This dictation was prepared with Dragon dictation along with smaller phrase technology. Any transcriptional errors that result from this process are unintentional.

## 2016-04-01 NOTE — Care Management (Signed)
[place] referral is being initiated.

## 2016-04-01 NOTE — Progress Notes (Signed)
Pt refused Cpap for sleep. Pt is in no distress

## 2016-04-01 NOTE — Progress Notes (Signed)
Patient Name: Colton Thompson Date of Encounter: 04/01/2016  Primary Cardiologist: New to [facility] - consult by Rockey Situ (followed by [facility] Problem List     Active Problems:   Acute on chronic combined systolic and diastolic CHF (congestive heart failure) (HCC)   Acute renal failure superimposed on stage 3 chronic kidney disease ([place])   Dilated cardiomyopathy ([place])   Hypertensive urgency   Centrilobular emphysema ([place])   Smoker   Pulmonary hypertension   Ischemic cardiomyopathy     Subjective   No acute overnight events. Awaiting 24 hour urine for CrCl. Renal function remains elevated at 2.74 today. No chest pain.   Inpatient Medications    Scheduled Meds: . alfuzosin  10 mg Oral QHS  . amiodarone  400 mg Oral BID  . aspirin EC  81 mg Oral Daily  . carvedilol  6.25 mg Oral BID WC  . cloNIDine  0.1 mg Oral BID  . docusate sodium  100 mg Oral BID  . feeding supplement (NEPRO CARB STEADY)  237 mL Oral BID BM  . hydrALAZINE  100 mg Oral TID  . insulin aspart  0-15 Units Subcutaneous TID WC  . insulin aspart  0-5 Units Subcutaneous QHS  . isosorbide mononitrate  60 mg Oral Daily  . pantoprazole  40 mg Oral Daily  . pravastatin  40 mg Oral Daily  . sodium chloride flush  3 mL Intravenous Q12H  . tamsulosin  0.4 mg Oral Daily  . vitamin B-12  1,000 mcg Oral Daily   Continuous Infusions: . heparin 1,200 Units/hr (03/31/16 1801)   PRN Meds: acetaminophen **OR** acetaminophen, diphenhydrAMINE, ondansetron **OR** ondansetron (ZOFRAN) IV   Vital Signs    Vitals:   03/31/16 1550 03/31/16 1700 03/31/16 1923 04/01/16 0425  BP: (!) 152/67 (!) 145/68 (!) 148/61 124/60  Pulse:   (!) 42 (!) 34  Resp:   18 17  Temp:   98.2 F (36.8 C) 97.8 F (36.6 C)  TempSrc:   Oral Oral  SpO2:   91% 98%  Weight:    160 lb 4.8 oz (72.7 kg)  Height:        Intake/Output Summary (Last 24 hours) at 04/01/16 0810 Last data filed at 04/01/16 0138  Gross per 24 hour    Intake            696.1 ml  Output              375 ml  Net            321.1 ml   Filed Weights   03/30/16 0412 03/31/16 0330 04/01/16 0425  Weight: 164 lb 6.4 oz (74.6 kg) 163 lb 1.6 oz (74 kg) 160 lb 4.8 oz (72.7 kg)    Physical Exam    GEN: Well nourished, well developed, in no acute distress.  HEENT: Grossly normal.  Neck: Supple, no JVD, carotid bruits, or masses. Cardiac: RRR, no murmurs, rubs, or gallops. No clubbing, cyanosis, edema.  Radials/DP/PT 2+ and equal bilaterally.  Respiratory:  Respirations regular and unlabored, clear to auscultation bilaterally. GI: Soft, nontender, nondistended, BS + x 4. MS: no deformity or atrophy. Skin: warm and dry, no rash. Neuro:  Strength and sensation are intact. Psych: AAOx3.  Normal affect.  Labs    CBC  Recent Labs  03/31/16 0535 04/01/16 0451  WBC 5.9 5.3  HGB 11.6* 10.9*  HCT 33.4* 32.5*  MCV 87.3 88.5  PLT 199 172   Basic  Metabolic Panel  Recent Labs  03/31/16 0535 04/01/16 0451  NA 135 135  K 4.0 3.8  CL 101 102  CO2 27 28  GLUCOSE 156* 160*  BUN 31* 34*  CREATININE 2.72* 2.74*  CALCIUM 8.3* 8.2*  MG 1.8  --   PHOS 3.9  --    Liver Function Tests  Recent Labs  03/31/16 0535  ALBUMIN 2.9*   No results for input(s): LIPASE, AMYLASE in the last 72 hours. Cardiac Enzymes No results for input(s): CKTOTAL, CKMB, CKMBINDEX, TROPONINI in the last 72 hours. BNP Invalid input(s): POCBNP D-Dimer No results for input(s): DDIMER in the last 72 hours. Hemoglobin A1C No results for input(s): HGBA1C in the last 72 hours. Fasting Lipid Panel No results for input(s): CHOL, HDL, LDLCALC, TRIG, CHOLHDL, LDLDIRECT in the last 72 hours. Thyroid Function Tests No results for input(s): TSH, T4TOTAL, T3FREE, THYROIDAB in the last 72 hours.  Invalid input(s): FREET3  Telemetry    NSR, 70's bpm with sinus arrhythmia, short run of atrial tach, occasional PVCs - Personally Reviewed  ECG    n/a - Personally  Reviewed  Radiology    US Renal  Result Date: 03/30/2016 CLINICAL DATA:  Acute renal failure . EXAM: RENAL / URINARY TRACT ULTRASOUND COMPLETE COMPARISON:  None. FINDINGS: Right Kidney: Length: 12.2 cm. Echogenicity within normal limits. No hydronephrosis visualized. Two simple cysts in the right kidney largest measures 2.2 cm. Left Kidney: Length: 12.8 cm. Echogenicity within normal limits. No hydronephrosis visualized. A 2 cm simple cyst is noted in the left kidney. Bladder: Appears normal for degree of bladder distention. Foley catheter is in place. IMPRESSION: No acute abnormality identified. Simple cysts noted both kidneys. No hydronephrosis or bladder distention. Foley catheter is in place. Electronically Signed   By: Marcello Moores  Register   On: 03/30/2016 09:57    Cardiac Studies   Echo 03/28/16: Study Conclusions  - Left ventricle: The cavity size was moderately dilated. Systolic function was severely reduced. The estimated ejection fraction was in the range of 20% to 25%. Diffuse hypokinesis. Regional wall motion abnormalities cannot be excluded. Findings consistent with left ventricular diastolic dysfunction. - Mitral valve: There was mild to moderate regurgitation. - Left atrium: The atrium was mildly dilated. - Right ventricle: Systolic function was normal. - Tricuspid valve: There was mild-moderate regurgitation. - Pulmonary arteries: Systolic pressure was severely elevated. PA peak pressure: 85 mm Hg (S).  Patient Profile     71 y.o. male with acute shortness of breath, prior history of CHF per his report, managed at the [place] ,renal failure withcreatinine 2.0 , ejection fraction 20 to 25% , long history of smoking who continues to smoke, chest x-ray concerning for edema, troponin up to 1.9and the setting of severe hypertension on arrival 225/125.  Assessment & Plan    1. NSTEMI: -Troponin peak of 1.9 -Chest pain free -On heparin gtt, has completed > 72 hours  will discontinue  -High suspicion of CAD given 50 years of smoking with reduced EF of 20-25% -Have been unable to pursue cardiac cath to date given elevated renal function that continues to worsen -Renal on board, awaiting results for 24 hour urine for creatinine clearance to determine need of possible dialysis  -Will await results from Renal prior to deciding about cardiac cath given worsening renal function -Consider staged cardiac cath with limited contrast this week pending renal function -For now, continue, Imdur, Coreg, and statin -Aspirin 81 mg daily  2. Acute on chronic systolic CHF: -EF 50-35% -  BNP elevated at 1900 -Lasix on hold given renal function -Amlodipine stopped given cardiomyopathy, titrate Imdur -Continue Coreg, Imdur/hydralazine  -Not on ACEi/ARB/spiro/Entresto given renal disease  3. Acute on CKD stage III: -Renal on board, await input from 24 hour urine  4. Hypertensive urgency: -Improved -Continue current medications  5. Pulmonary hypertension: -Will need R/LHC  -As above  6. NSVT: -No further ventricular runs -Continue PO amiodarone loading, if he has further ectopy may need to start IV amiodarone load -Coreg as above -May need LifeVest prior to discharge given EF and ventricular ectopy  -Add magnesium oxide 400 mg daily   Signed, Marcille Blanco [place] Pager: [phone] 04/01/2016, 8:10 AM   Attending Note Patient seen and examined, agree with detailed note above,  Patient presentation and plan discussed on rounds.   Patient reports that he feels well as morning, denies any chest pain No family at the bedside Telemetry reviewed, no significant arrhythmia  On physical exam lungs clear, heart sounds regular, no murmurs appreciated, abdomen soft nontender, no significant lower extremity edema  Nephrology following, 24 urine collection showing GFR low 30s Discussed case with nephrology,  Cardiac catheterization scheduled  for Friday morning February 16 Will be done by Dr. Fletcher Anon, Given underlying renal dysfunction, may need staged procedure if intervention required  Discussed his need for life vest with the patient and hospitalist service Ejection fraction less than 35%, ischemic cardiomyopathy Already had 30 second run of VT, was started on amiodarone this admission I started the order for life vest  Greater than 50% was spent in counseling and coordination of care with patient Total encounter time 35 minutes or more   Signed: Esmond Plants  M.D., Ph.D. [facility]

## 2016-04-02 LAB — PROTEIN ELECTROPHORESIS, SERUM
A/G RATIO SPE: 1.2 (ref 0.7–1.7)
ALPHA-1-GLOBULIN: 0.2 g/dL (ref 0.0–0.4)
Albumin ELP: 2.7 g/dL — ABNORMAL LOW (ref 2.9–4.4)
Alpha-2-Globulin: 0.6 g/dL (ref 0.4–1.0)
Beta Globulin: 0.7 g/dL (ref 0.7–1.3)
GLOBULIN, TOTAL: 2.2 g/dL (ref 2.2–3.9)
Gamma Globulin: 0.7 g/dL (ref 0.4–1.8)
TOTAL PROTEIN ELP: 4.9 g/dL — AB (ref 6.0–8.5)

## 2016-04-02 LAB — CBC
HCT: 31.8 % — ABNORMAL LOW (ref 40.0–52.0)
Hemoglobin: 11 g/dL — ABNORMAL LOW (ref 13.0–18.0)
MCH: 30.1 pg (ref 26.0–34.0)
MCHC: 34.4 g/dL (ref 32.0–36.0)
MCV: 87.5 fL (ref 80.0–100.0)
Platelets: 186 10*3/uL (ref 150–440)
RBC: 3.64 MIL/uL — ABNORMAL LOW (ref 4.40–5.90)
RDW: 14.7 % — ABNORMAL HIGH (ref 11.5–14.5)
WBC: 5.6 10*3/uL (ref 3.8–10.6)

## 2016-04-02 LAB — BASIC METABOLIC PANEL
Anion gap: 6 (ref 5–15)
BUN: 42 mg/dL — AB (ref 6–20)
CALCIUM: 8.4 mg/dL — AB (ref 8.9–10.3)
CHLORIDE: 102 mmol/L (ref 101–111)
CO2: 27 mmol/L (ref 22–32)
CREATININE: 3.18 mg/dL — AB (ref 0.61–1.24)
GFR calc non Af Amer: 18 mL/min — ABNORMAL LOW (ref >60)
GFR, EST AFRICAN AMERICAN: 21 mL/min — AB (ref >60)
GLUCOSE: 156 mg/dL — AB (ref 65–99)
Potassium: 4.2 mmol/L (ref 3.5–5.1)
Sodium: 135 mmol/L (ref 135–145)

## 2016-04-02 LAB — ANA W/REFLEX IF POSITIVE: ANA: NEGATIVE

## 2016-04-02 LAB — ANCA TITERS
C-ANCA: <1:20 {titer}
P-ANCA: <1:20 {titer}

## 2016-04-02 LAB — GLUCOSE, CAPILLARY
Glucose-Capillary: 153 mg/dL — ABNORMAL HIGH (ref 65–99)
Glucose-Capillary: 246 mg/dL — ABNORMAL HIGH (ref 65–99)
Glucose-Capillary: 183 mg/dL — ABNORMAL HIGH (ref 65–99)
Glucose-Capillary: 214 mg/dL — ABNORMAL HIGH (ref 65–99)

## 2016-04-02 MED ORDER — SALINE SPRAY 0.65 % NA SOLN
1.0000 | NASAL | Status: DC | PRN
  Administered 2016-04-02 – 2016-04-03 (×3): 1 via NASAL
  Filled 2016-04-02: qty 44

## 2016-04-02 MED ORDER — INSULIN GLARGINE 100 UNIT/ML ~~LOC~~ SOLN
7.0000 [IU] | Freq: Every day | SUBCUTANEOUS | Status: DC
Start: 2016-04-02 — End: 2016-04-03
  Administered 2016-04-02: 7 [IU] via SUBCUTANEOUS
  Filled 2016-04-02 (×2): qty 0.07

## 2016-04-02 MED ORDER — POLYETHYLENE GLYCOL 3350 17 G PO PACK
17.0000 g | PACK | Freq: Every day | ORAL | Status: DC | PRN
  Administered 2016-04-02: 17 g via ORAL
  Filled 2016-04-02: qty 1

## 2016-04-02 MED ORDER — SENNA 8.6 MG PO TABS
1.0000 | ORAL_TABLET | Freq: Every day | ORAL | Status: DC
  Administered 2016-04-02 – 2016-04-03 (×2): 8.6 mg via ORAL
  Filled 2016-04-02 (×2): qty 1

## 2016-04-02 MED ORDER — CARVEDILOL 3.125 MG PO TABS
3.1250 mg | ORAL_TABLET | Freq: Two times a day (BID) | ORAL | Status: DC
  Administered 2016-04-03 – 2016-04-04 (×2): 3.125 mg via ORAL
  Filled 2016-04-02 (×2): qty 1

## 2016-04-02 NOTE — Plan of Care (Signed)
Problem: Activity: Goal: Risk for activity intolerance will decrease Outcome: Progressing Up walking in room, transfer to chair.  Feels fatigued from a little movement.  Understands he needs to keep increasing activity.

## 2016-04-02 NOTE — Progress Notes (Signed)
Last BM was Monday 2/12.  Feels like he has to have a BM.  Receiving Colace regularly.  Miralax given this afternoon.  Will monitor.  If no success, will follow up with other laxatives.

## 2016-04-02 NOTE — Progress Notes (Signed)
Subjective:  Patient continues to feel well with no nausea or vomiting. Eating well and drinking Nephro shakes.  Cr clearance at 34 UOP 700 cc yesterday. Foley removed this morning S Cr 3.18 (2.74)    Objective:  Vital signs in last 24 hours:  Temp:  [97.7 F (36.5 C)-98.7 F (37.1 C)] 97.9 F (36.6 C) (02/15 1115) Pulse Rate:  [51-73] 51 (02/15 1115) Resp:  [15-16] 16 (02/15 1115) BP: (107-152)/(51-68) 107/51 (02/15 1115) SpO2:  [92 %-98 %] 93 % (02/15 1115) Weight:  [74.4 kg (164 lb)-74.6 kg (164 lb 6.4 oz)] 74.4 kg (164 lb) (02/15 0334)  Weight change: 1.86 kg (4 lb 1.6 oz) Filed Weights   04/01/16 0425 04/01/16 1807 04/02/16 0334  Weight: 72.7 kg (160 lb 4.8 oz) 74.6 kg (164 lb 6.4 oz) 74.4 kg (164 lb)    Intake/Output:    Intake/Output Summary (Last 24 hours) at 04/02/16 1131 Last data filed at 04/02/16 1004  Gross per 24 hour  Intake              720 ml  Output              700 ml  Net               20 ml     Physical Exam: General: No acute distress, laying in the bed   HEENT Anicteric, moist oral mucous membranes   Neck Supple   Pulm/lungs Decreased breath sounds at bases   CVS/Heart Irregular, no rub   Abdomen:  Soft, nontender   Extremities: No edema, proximal and distal muscle wasting   Neurologic: Alert, oriented,   Skin: No acute rashes           Basic Metabolic Panel:   Recent Labs Lab 03/29/16 1150 03/30/16 0645 03/31/16 0535 04/01/16 0451 04/02/16 0510  NA 135 134* 135 135 135  K 4.0 4.2 4.0 3.8 4.2  CL 103 103 101 102 102  CO2 26 24 27 28 27   GLUCOSE 157* 130* 156* 160* 156*  BUN 26* 26* 31* 34* 42*  CREATININE 2.61* 2.69* 2.72* 2.74* 3.18*  CALCIUM 8.0* 8.1* 8.3* 8.2* 8.4*  MG  --   --  1.8  --   --   PHOS  --   --  3.9  --   --      CBC:  Recent Labs Lab 03/28/16 0006 03/31/16 0535 04/01/16 0451 04/02/16 0510  WBC 7.5 5.9 5.3 5.6  NEUTROABS 4.8  --   --   --   HGB 13.2 11.6* 10.9* 11.0*  HCT 40.4 33.4* 32.5*  31.8*  MCV 89.3 87.3 88.5 87.5  PLT 235 199 172 186      Microbiology:  No results found for this or any previous visit (from the past 720 hour(s)).  Coagulation Studies: No results for input(s): LABPROT, INR in the last 72 hours.  Urinalysis: No results for input(s): COLORURINE, LABSPEC, PHURINE, GLUCOSEU, HGBUR, BILIRUBINUR, KETONESUR, PROTEINUR, UROBILINOGEN, NITRITE, LEUKOCYTESUR in the last 72 hours.  Invalid input(s): APPERANCEUR    Imaging: No results found.   Medications:   . sodium chloride     . alfuzosin  10 mg Oral QHS  . amiodarone  400 mg Oral BID  . aspirin EC  81 mg Oral Daily  . carvedilol  6.25 mg Oral BID WC  . cloNIDine  0.1 mg Oral BID  . docusate sodium  100 mg Oral BID  . feeding supplement (NEPRO CARB STEADY)  237 mL Oral BID BM  . heparin subcutaneous  5,000 Units Subcutaneous Q8H  . hydrALAZINE  100 mg Oral TID  . insulin aspart  0-15 Units Subcutaneous TID WC  . insulin aspart  0-5 Units Subcutaneous QHS  . isosorbide mononitrate  60 mg Oral Daily  . magnesium oxide  400 mg Oral Daily  . pantoprazole  40 mg Oral Daily  . pravastatin  40 mg Oral Daily  . sodium chloride flush  3 mL Intravenous Q12H  . sodium chloride flush  3 mL Intravenous Q12H  . tamsulosin  0.4 mg Oral Daily  . vitamin B-12  1,000 mcg Oral Daily   sodium chloride, acetaminophen **OR** acetaminophen, diphenhydrAMINE, ondansetron **OR** ondansetron (ZOFRAN) IV, polyethylene glycol, sodium chloride, sodium chloride flush  Assessment/ Plan:  71 y.o. African-American male with  medical problems of Long-standing type 2 diabetes with severe neuropathy, cataracts, hypertension, BPH, severe systolic congestive heart failure who was admitted to [facility] on 03/28/2016 for evaluation of respiratory distress.   1. Chronic kidney disease stage 4 2. Diabetes type 2 with chronic kidney disease and severe diabetic neuropathy 3. Chronic systolic congestive heart failure. EF 20% 4.  Hypertension   Plan 1. 24 hr urine for Cr clearance resulted in 34 cc/min 2. Discussed need for cardiac Cath and potential injury to the kidneys with iv contrast exposure. Discussed with Dr Rockey Situ. Staged procedure planned for minimal dye exposure 3. ANA, ANCA, SPEP, UPEP pending 4. Bladder scan mid day today No clear cause for increase in creatinine - will continue to monitor   LOS: 5 Colton Thompson 2/15/201811:31 AM

## 2016-04-02 NOTE — Progress Notes (Signed)
Patient went to the br to attempt to have a BM.  Because of his severe retention there wasn't a hat in the toilet.  No BM but he urinated.  Pre bladder scan = 234.  Post void scan = 44.

## 2016-04-02 NOTE — Progress Notes (Addendum)
[place] [facility] [place] NAME: Colton Thompson    MR#:  [id number]  DATE OF BIRTH:  07/23/1945  SUBJECTIVE:  CHIEF COMPLAINT:   Chief Complaint  Patient presents with  . Respiratory Distress   No SOB.  Foley removed No chest pain  Cath cancelled due to worsening BUN/Cr REVIEW OF SYSTEMS:    Review of Systems  Constitutional: Positive for malaise/fatigue. Negative for chills and fever.  HENT: Negative for sore throat.   Eyes: Negative for blurred vision, double vision and pain.  Respiratory: Positive for cough, sputum production and shortness of breath. Negative for hemoptysis and wheezing.   Cardiovascular: Negative for chest pain, palpitations, orthopnea and leg swelling.  Gastrointestinal: Negative for abdominal pain, constipation, diarrhea, heartburn, nausea and vomiting.  Genitourinary: Negative for dysuria and hematuria.  Musculoskeletal: Negative for back pain and joint pain.  Skin: Negative for rash.  Neurological: Positive for weakness. Negative for sensory change, speech change, focal weakness and headaches.  Endo/Heme/Allergies: Does not bruise/bleed easily.  Psychiatric/Behavioral: Negative for depression. The patient is not nervous/anxious.     DRUG ALLERGIES:  No Known Allergies  VITALS:  Blood pressure (!) 107/51, pulse (!) 51, temperature 97.9 F (36.6 C), temperature source Oral, resp. rate 16, height 6' (1.829 m), weight 74.4 kg (164 lb), SpO2 93 %.  PHYSICAL EXAMINATION:   Physical Exam  GENERAL:  71 y.o.-year-old patient lying in the bed with no acute distress.  EYES: Pupils equal, round, reactive to light and accommodation. No scleral icterus. Extraocular muscles intact.  HEENT: Head atraumatic, normocephalic. Oropharynx and nasopharynx clear.  NECK:  Supple, no jugular venous distention. No thyroid enlargement, no tenderness.  LUNGS: Normal WOB. Bibasilar crackles CARDIOVASCULAR: S1, S2 normal. No murmurs,  rubs, or gallops.  ABDOMEN: Soft, nontender, nondistended. Bowel sounds present. No organomegaly or mass.  EXTREMITIES: No cyanosis, clubbing or edema b/l.    NEUROLOGIC: Cranial nerves II through XII are intact. No focal Motor or sensory deficits b/l.   PSYCHIATRIC: The patient is alert and oriented x 3.  SKIN: No obvious rash, lesion, or ulcer.   LABORATORY PANEL:   CBC  Recent Labs Lab 04/02/16 0510  WBC 5.6  HGB 11.0*  HCT 31.8*  PLT 186   ------------------------------------------------------------------------------------------------------------------ Chemistries   Recent Labs Lab 03/28/16 0006  03/31/16 0535  04/02/16 0510  NA 135  < > 135  < > 135  K 4.3  < > 4.0  < > 4.2  CL 103  < > 101  < > 102  CO2 24  < > 27  < > 27  GLUCOSE 187*  < > 156*  < > 156*  BUN 22*  < > 31*  < > 42*  CREATININE 2.07*  < > 2.72*  < > 3.18*  CALCIUM 8.4*  < > 8.3*  < > 8.4*  MG  --   --  1.8  --   --   AST 23  --   --   --   --   ALT 12*  --   --   --   --   ALKPHOS 56  --   --   --   --   BILITOT 0.6  --   --   --   --   < > = values in this interval not displayed. ------------------------------------------------------------------------------------------------------------------  Cardiac Enzymes  Recent Labs Lab 03/28/16 1628  TROPONINI 1.92*   ------------------------------------------------------------------------------------------------------------------  RADIOLOGY:  No results found.  ASSESSMENT AND PLAN:   * Acute on chronic systolic chf with acute hypoxic resp failure - IV Lasix stopped due to ARF - Beta blockers. - Input and Output - Counseled to limit fluids and Salt - Monitor Bun/Cr and Potassium - Echo - EF 20-25%  * Cardiomyopathy Likely ischemic  * AKI over CKD 3 or 4 - worsening Baseline unknown. 24 hour creatinine clearance is 34 Monitor after catheterization  * Elevated troponin Demand ischemia vs MI. With cardiomyopathy suspected to be  ischemic he will need cardiac catheterization. Will need staged cath. Scheduled for 04/03/2016  * NSVT On amiodarone Will need Lifevest at discharge. Discussed with [facility] representative.  * Accelerated HTN Improved with meds.  * DM2 SSI Add lantus 7 units daily  * Tobacco use counseled previously  * Urinary retention due to BPH On flomax Foley removed. Bladder scan PRN Reinsert foley if unable to void.  All the records are reviewed and case discussed with Care Management/Social Workerr. Management plans discussed with the patient, family and they are in agreement.  CODE STATUS: FULL CODE  DVT Prophylaxis: SCDs  TOTAL TIME TAKING CARE OF THIS PATIENT: 35 minutes.   POSSIBLE D/C IN 2-3 DAYS, DEPENDING ON CLINICAL CONDITION.  Hillary Bow R M.D on 04/02/2016 at 1:24 PM  Between 7am to 6pm - Pager - [phone]  After 6pm go to [email or web address] - password [email or web address] [facility]  Office  [phone]  CC: Primary care physician; Pcp Not In System  Note: This dictation was prepared with Dragon dictation along with smaller phrase technology. Any transcriptional errors that result from this process are unintentional.

## 2016-04-02 NOTE — Progress Notes (Signed)
Pt is refusing to wear CPAP

## 2016-04-02 NOTE — Progress Notes (Signed)
Inpatient Diabetes Program Recommendations  AACE/ADA: New Consensus Statement on Inpatient Glycemic Control (2015)  Target Ranges:  Prepandial:   less than 140 mg/dL      Peak postprandial:   less than 180 mg/dL (1-2 hours)      Critically ill patients:  140 - 180 mg/dL   Lab Results  Component Value Date   GLUCAP 246 (H) 04/02/2016   HGBA1C 5.9 (H) 03/28/2016    Review of Glycemic Control  Results for Colton Thompson, Colton Thompson (MRN [id number]) as of 04/02/2016 12:15  Ref. Range 04/01/2016 11:51 04/01/2016 17:11 04/01/2016 21:21 04/02/2016 07:54 04/02/2016 11:57  Glucose-Capillary Latest Ref Range: 65 - 99 mg/dL 210 (H) 166 (H) 276 (H) 153 (H) 246 (H)    Diabetes history: Type 2 Outpatient Diabetes medications: Lantus 15 units qhs, Metformin 1000mg  qam, 1500mg  qpm Current orders for Inpatient glycemic control: Novolog 0-15 units tid, Novolog 0-5 units qhs  Inpatient Diabetes Program Recommendations:  Consider adding Lantus, 1/2 home dose 8 units qhs beginning tonight, consider decreasing Novolog correction to sensitive 0-9 tid.  Gentry Fitz, RN, BA, MHA, CDE Diabetes Coordinator Inpatient Diabetes Program  [phone] (Team Pager) [phone] ([place]) 04/02/2016 12:17 PM

## 2016-04-02 NOTE — Progress Notes (Signed)
Patient Name: Colton Thompson Date of Encounter: 04/02/2016  Primary Cardiologist: New to [facility] - consult by Rockey Situ (followed by [facility] Problem List     Active Problems:   Acute on chronic combined systolic and diastolic CHF (congestive heart failure) (HCC)   Acute renal failure superimposed on stage 3 chronic kidney disease ([place])   Dilated cardiomyopathy ([place])   Hypertensive urgency   Centrilobular emphysema (HCC)   Smoker   Pulmonary hypertension   Ischemic cardiomyopathy   ARF (acute renal failure) (HCC)   VT (ventricular tachycardia) (HCC)   Chronic renal failure, stage 4 (severe) (HCC)     Subjective   No acute overnight events. Some nasal congestion this morning. Renal function bumped this morning to 3.18 from 2.74 the day prior. Preliminarily scheduled for LHC on 2/16 with Dr. Fletcher Anon. 24 hour urine CrCl of 34.   Inpatient Medications    Scheduled Meds: . alfuzosin  10 mg Oral QHS  . amiodarone  400 mg Oral BID  . aspirin EC  81 mg Oral Daily  . carvedilol  6.25 mg Oral BID WC  . cloNIDine  0.1 mg Oral BID  . docusate sodium  100 mg Oral BID  . feeding supplement (NEPRO CARB STEADY)  237 mL Oral BID BM  . heparin subcutaneous  5,000 Units Subcutaneous Q8H  . hydrALAZINE  100 mg Oral TID  . insulin aspart  0-15 Units Subcutaneous TID WC  . insulin aspart  0-5 Units Subcutaneous QHS  . isosorbide mononitrate  60 mg Oral Daily  . magnesium oxide  400 mg Oral Daily  . pantoprazole  40 mg Oral Daily  . pravastatin  40 mg Oral Daily  . sodium chloride flush  3 mL Intravenous Q12H  . sodium chloride flush  3 mL Intravenous Q12H  . tamsulosin  0.4 mg Oral Daily  . vitamin B-12  1,000 mcg Oral Daily   Continuous Infusions: . sodium chloride     PRN Meds: sodium chloride, acetaminophen **OR** acetaminophen, diphenhydrAMINE, ondansetron **OR** ondansetron (ZOFRAN) IV, sodium chloride, sodium chloride flush   Vital Signs    Vitals:   04/01/16  1649 04/01/16 1807 04/01/16 1934 04/02/16 0334  BP:   125/68 139/66  Pulse:   (!) 57 73  Resp:   15 16  Temp:   98.7 F (37.1 C)   TempSrc:   Oral   SpO2: 97%  97% 94%  Weight:  164 lb 6.4 oz (74.6 kg)  164 lb (74.4 kg)  Height:        Intake/Output Summary (Last 24 hours) at 04/02/16 0756 Last data filed at 04/02/16 0606  Gross per 24 hour  Intake              720 ml  Output              700 ml  Net               20 ml   Filed Weights   04/01/16 0425 04/01/16 1807 04/02/16 0334  Weight: 160 lb 4.8 oz (72.7 kg) 164 lb 6.4 oz (74.6 kg) 164 lb (74.4 kg)    Physical Exam    GEN: Well nourished, well developed, in no acute distress.  HEENT: Grossly normal.  Neck: Supple, no JVD, carotid bruits, or masses. Cardiac: RRR, no murmurs, rubs, or gallops. No clubbing, cyanosis, edema.  Radials/DP/PT 2+ and equal bilaterally.  Respiratory:  Respirations regular and unlabored, clear to  auscultation bilaterally. GI: Soft, nontender, nondistended, BS + x 4. MS: no deformity or atrophy. Skin: warm and dry, no rash. Neuro:  Strength and sensation are intact. Psych: AAOx3.  Normal affect.  Labs    CBC  Recent Labs  04/01/16 0451 04/02/16 0510  WBC 5.3 5.6  HGB 10.9* 11.0*  HCT 32.5* 31.8*  MCV 88.5 87.5  PLT 172 379   Basic Metabolic Panel  Recent Labs  03/31/16 0535 04/01/16 0451 04/02/16 0510  NA 135 135 135  K 4.0 3.8 4.2  CL 101 102 102  CO2 27 28 27   GLUCOSE 156* 160* 156*  BUN 31* 34* 42*  CREATININE 2.72* 2.74* 3.18*  CALCIUM 8.3* 8.2* 8.4*  MG 1.8  --   --   PHOS 3.9  --   --    Liver Function Tests  Recent Labs  03/31/16 0535  ALBUMIN 2.9*   No results for input(s): LIPASE, AMYLASE in the last 72 hours. Cardiac Enzymes No results for input(s): CKTOTAL, CKMB, CKMBINDEX, TROPONINI in the last 72 hours. BNP Invalid input(s): POCBNP D-Dimer No results for input(s): DDIMER in the last 72 hours. Hemoglobin A1C No results for input(s): HGBA1C in the  last 72 hours. Fasting Lipid Panel No results for input(s): CHOL, HDL, LDLCALC, TRIG, CHOLHDL, LDLDIRECT in the last 72 hours. Thyroid Function Tests No results for input(s): TSH, T4TOTAL, T3FREE, THYROIDAB in the last 72 hours.  Invalid input(s): FREET3  Telemetry    Sinus bradycardia, 50's bpm - Personally Reviewed  ECG    n/a - Personally Reviewed  Radiology    No results found.  Cardiac Studies   Echo 03/28/16: Study Conclusions  - Left ventricle: The cavity size was moderately dilated. Systolic function was severely reduced. The estimated ejection fraction was in the range of 20% to 25%. Diffuse hypokinesis. Regional wall motion abnormalities cannot be excluded. Findings consistent with left ventricular diastolic dysfunction. - Mitral valve: There was mild to moderate regurgitation. - Left atrium: The atrium was mildly dilated. - Right ventricle: Systolic function was normal. - Tricuspid valve: There was mild-moderate regurgitation. - Pulmonary arteries: Systolic pressure was severely elevated. PA peak pressure: 85 mm Hg (S).  Patient Profile     71 y.o. male with acute shortness of breath, prior history of CHF per his report, managed at the [place] ,renal failure withcreatinine 2.0 , ejection fraction 20 to 25% , long history of smoking who continues to smoke, chest x-ray concerning for edema, troponin up to 1.9and the setting of severe hypertension on arrival 225/125.  Assessment & Plan    1. NSTEMI: -Troponin peak of 1.9 -Chest pain free -Status post heparin gtt for medical management  -High suspicion of CAD given 50 years of smoking with reduced EF of 20-25% -Have been unable to pursue cardiac cath to date given elevated renal function that continues to worsen -Renal on board, with CrCl of 34 mL/min -Serum creatinine bumped this morning to 3.18 from 2.74 -For LHC with limited contrast on 2/16, pending trend of renal function -Will need IV  hydration overnight -Discuss with renal regarding timing and amount of IV hydration -For now, continue, Imdur, Coreg, and statin -Aspirin 81 mg daily  2. Acute on chronic systolic CHF: -EF 02-40% -BNP elevated at 1900 -Lasix on hold given renal function -Amlodipine stopped given cardiomyopathy, titrate Imdur -Continue Coreg, Imdur/hydralazine  -Not on ACEi/ARB/spiro/Entresto given renal disease  3. Acute on CKD stage III: -Renal on board -As above  4. Hypertensive urgency: -Improved -  Continue current medications  5. Pulmonary hypertension: -Will need R/LHC  -As above  6. NSVT: -No further ventricular runs -Continue PO amiodarone loading, if he has further ectopy may need to start IV amiodarone load -Coreg as above -May need LifeVest prior to discharge given EF and ventricular ectopy, paperwork has been started -Add magnesium oxide 400 mg daily   Signed, Marcille Blanco Antoine Pager: [phone] 04/02/2016, 7:56 AM   Attending Note Patient seen and examined, agree with detailed note above,  Patient presentation and plan discussed on rounds.   SOB with exertion,  He does not feel as well, less appetite Discussed need for cardiac catheterization, he is willing to proceed tomorrow We did discuss his renal function with him, creatinine now up to greater than 3 Likely trending towards dialysis Recommendation made by nephrology for staged procedure to minimize contrast exposure  On clinical exam lungs with dullness at the bases, otherwise mild scattered Rales, heart sounds regular with S4, abdomen soft nontender, no significant lower extremity edema  --- Non-STEMI Cardiac catheterization scheduled tomorrow morning Recommend staged procedure if possible given underlying renal failure Nephrology aware Need to proceed given heart failure symptoms, likely underlying ischemic heart disease I have reviewed the risks, indications, and alternatives to  cardiac catheterization, possible angioplasty, and stenting with the patient. Risks include but are not limited to bleeding, infection, vascular injury, stroke, myocardial infection, arrhythmia, kidney injury, radiation-related injury in the case of prolonged fluoroscopy use, emergency cardiac surgery, and death. The patient understands the risks of serious complication is 1-2 in 8295 with diagnostic cardiac cath and 1-2% or less with angioplasty/stenting.   --- VT On amiodarone, no further arrhythmia  --- Renal failure Chronic disease, diabetic nephropathy  Discussed case with nursing, Dr. Fletcher Anon who will be performing catheterization tomorrow, nephrology  Greater than 50% was spent in counseling and coordination of care with patient Total encounter time 35 minutes or more   Signed: Esmond Plants  M.D., Ph.D. [facility]

## 2016-04-02 NOTE — Care Management (Addendum)
Spoke with Colton Thompson at [facility] and provided update.  Spoke with patient and wife and was informed they really do not want to transfer to [facility], but if it means refusal will make them liable for part of the hospital bill, they will sign agreement to transfer.   Reaching out to attending for signature.  Informed Colton Thompson will fax forms when obtain signature of attending.  Agreeable to home health. Agency preference is [employer] as this agency provides home health to [facility] patients.  Notified [facility] and that patient will have [facility].

## 2016-04-03 ENCOUNTER — Encounter
Admission: EM | Disposition: A | Discharge status: Home Health | Payer: Self-pay | Source: Home / Self Care | Attending: Internal Medicine

## 2016-04-03 ENCOUNTER — Encounter: Payer: Self-pay | Admitting: Cardiovascular Disease

## 2016-04-03 DIAGNOSIS — I5023 Acute on chronic systolic (congestive) heart failure: Secondary | ICD-10-CM

## 2016-04-03 DIAGNOSIS — I2510 Atherosclerotic heart disease of native coronary artery without angina pectoris: Secondary | ICD-10-CM

## 2016-04-03 HISTORY — PX: RIGHT/LEFT HEART CATH AND CORONARY ANGIOGRAPHY: CATH118266

## 2016-04-03 LAB — BASIC METABOLIC PANEL
Anion gap: 9 (ref 5–15)
BUN: 45 mg/dL — AB (ref 6–20)
CALCIUM: 8 mg/dL — AB (ref 8.9–10.3)
CO2: 25 mmol/L (ref 22–32)
CREATININE: 3.09 mg/dL — AB (ref 0.61–1.24)
Chloride: 98 mmol/L — ABNORMAL LOW (ref 101–111)
GFR calc Af Amer: 22 mL/min — ABNORMAL LOW (ref >60)
GFR, EST NON AFRICAN AMERICAN: 19 mL/min — AB (ref >60)
GLUCOSE: 191 mg/dL — AB (ref 65–99)
POTASSIUM: 4.8 mmol/L (ref 3.5–5.1)
SODIUM: 132 mmol/L — AB (ref 135–145)

## 2016-04-03 LAB — GLUCOSE, CAPILLARY
Glucose-Capillary: 241 mg/dL — ABNORMAL HIGH (ref 65–99)
Glucose-Capillary: 140 mg/dL — ABNORMAL HIGH (ref 65–99)
Glucose-Capillary: 163 mg/dL — ABNORMAL HIGH (ref 65–99)

## 2016-04-03 SURGERY — RIGHT/LEFT HEART CATH AND CORONARY ANGIOGRAPHY

## 2016-04-03 MED ORDER — ISOSORBIDE MONONITRATE ER 60 MG PO TB24
60.0000 mg | ORAL_TABLET | Freq: Two times a day (BID) | ORAL | Status: DC
  Administered 2016-04-03 – 2016-04-04 (×2): 60 mg via ORAL
  Filled 2016-04-03 (×2): qty 1

## 2016-04-03 MED ORDER — IOPAMIDOL (ISOVUE-300) INJECTION 61%
INTRAVENOUS | Status: DC | PRN
  Administered 2016-04-03: 30 mL via INTRAVENOUS

## 2016-04-03 MED ORDER — HEPARIN SODIUM (PORCINE) 1000 UNIT/ML IJ SOLN
INTRAMUSCULAR | Status: AC
  Filled 2016-04-03: qty 1

## 2016-04-03 MED ORDER — VERAPAMIL HCL 2.5 MG/ML IV SOLN
INTRAVENOUS | Status: AC
  Filled 2016-04-03: qty 2

## 2016-04-03 MED ORDER — MIDAZOLAM HCL 2 MG/2ML IJ SOLN
INTRAMUSCULAR | Status: AC
  Filled 2016-04-03: qty 2

## 2016-04-03 MED ORDER — INSULIN GLARGINE 100 UNIT/ML ~~LOC~~ SOLN
12.0000 [IU] | Freq: Every day | SUBCUTANEOUS | Status: DC
  Administered 2016-04-03: 12 [IU] via SUBCUTANEOUS
  Filled 2016-04-03 (×2): qty 0.12

## 2016-04-03 MED ORDER — FENTANYL CITRATE (PF) 100 MCG/2ML IJ SOLN
INTRAMUSCULAR | Status: AC
  Filled 2016-04-03: qty 2

## 2016-04-03 MED ORDER — SODIUM CHLORIDE 0.9% FLUSH
3.0000 mL | Freq: Two times a day (BID) | INTRAVENOUS | Status: DC
  Administered 2016-04-03 – 2016-04-04 (×2): 3 mL via INTRAVENOUS

## 2016-04-03 MED ORDER — FUROSEMIDE 40 MG PO TABS
40.0000 mg | ORAL_TABLET | Freq: Two times a day (BID) | ORAL | Status: DC
  Administered 2016-04-03: 40 mg via ORAL
  Filled 2016-04-03: qty 1

## 2016-04-03 MED ORDER — AMIODARONE HCL 200 MG PO TABS
200.0000 mg | ORAL_TABLET | Freq: Two times a day (BID) | ORAL | Status: DC
  Administered 2016-04-03 – 2016-04-04 (×2): 200 mg via ORAL
  Filled 2016-04-03 (×2): qty 1

## 2016-04-03 MED ORDER — HEPARIN SODIUM (PORCINE) 1000 UNIT/ML IJ SOLN
INTRAMUSCULAR | Status: DC | PRN
  Administered 2016-04-03: 4000 [IU] via INTRAVENOUS

## 2016-04-03 MED ORDER — ISOSORBIDE MONONITRATE ER 30 MG PO TB24: 30.0000 mg | ORAL_TABLET | Freq: Every day | ORAL | Status: DC

## 2016-04-03 MED ORDER — FENTANYL CITRATE (PF) 100 MCG/2ML IJ SOLN
INTRAMUSCULAR | Status: DC | PRN
  Administered 2016-04-03: 25 ug via INTRAVENOUS

## 2016-04-03 MED ORDER — MIDAZOLAM HCL 2 MG/2ML IJ SOLN
INTRAMUSCULAR | Status: DC | PRN
  Administered 2016-04-03: 1 mg via INTRAVENOUS

## 2016-04-03 MED ORDER — HEPARIN (PORCINE) IN NACL 2-0.9 UNIT/ML-% IJ SOLN
INTRAMUSCULAR | Status: AC
  Filled 2016-04-03: qty 1000

## 2016-04-03 MED ORDER — SODIUM CHLORIDE 0.9% FLUSH: 3.0000 mL | INTRAVENOUS | Status: DC | PRN

## 2016-04-03 MED ORDER — SODIUM CHLORIDE 0.9 % IV SOLN: 250.0000 mL | INTRAVENOUS | Status: DC | PRN

## 2016-04-03 SURGICAL SUPPLY — 9 items
CATH BALLN WEDGE 5F 110CM (CATHETERS) ×3 IMPLANT
CATH OPTITORQUE JACKY 4.0 5F (CATHETERS) ×3 IMPLANT
DEVICE RAD TR BAND REGULAR (VASCULAR PRODUCTS) ×3 IMPLANT
GLIDESHEATH SLEND SS 6F .021 (SHEATH) ×6 IMPLANT
GUIDEWIRE EMER 3M J .025X150CM (WIRE) ×3 IMPLANT
KIT MANI 3VAL PERCEP (MISCELLANEOUS) ×3 IMPLANT
KIT RIGHT HEART (MISCELLANEOUS) ×3 IMPLANT
PACK CARDIAC CATH (CUSTOM PROCEDURE TRAY) ×3 IMPLANT
WIRE ROSEN-J .035X260CM (WIRE) ×3 IMPLANT

## 2016-04-03 NOTE — Progress Notes (Signed)
Patient: Colton Thompson / Admit Date: 03/28/2016 / Date of Encounter: 04/03/2016, 4:59 PM   Hospital Problem List     Active Problems:   Acute on chronic combined systolic and diastolic CHF (congestive heart failure) ([place])   Acute renal failure superimposed on stage 3 chronic kidney disease ([place])   Dilated cardiomyopathy (HCC)   Hypertensive urgency   Centrilobular emphysema (HCC)   Smoker   Pulmonary hypertension   Ischemic cardiomyopathy   ARF (acute renal failure) (HCC)   VT (ventricular tachycardia) (HCC)   Chronic renal failure, stage 4 (severe) (HCC)     Subjective   Cardiac catheterization left and right performed today Results discussed with the patient, wife at the bedside 60-70% lesions, three-vessel disease noted Mildly elevated right heart pressures, not markedly elevated  Blood pressure continues to run high , heart rate in the 50s    Inpatient Medications    Scheduled Meds: . alfuzosin  10 mg Oral QHS  . amiodarone  400 mg Oral BID  . aspirin EC  81 mg Oral Daily  . carvedilol  3.125 mg Oral BID WC  . cloNIDine  0.1 mg Oral BID  . docusate sodium  100 mg Oral BID  . feeding supplement (NEPRO CARB STEADY)  237 mL Oral BID BM  . furosemide  40 mg Oral BID  . heparin subcutaneous  5,000 Units Subcutaneous Q8H  . hydrALAZINE  100 mg Oral TID  . insulin aspart  0-15 Units Subcutaneous TID WC  . insulin aspart  0-5 Units Subcutaneous QHS  . insulin glargine  12 Units Subcutaneous QHS  . isosorbide mononitrate  60 mg Oral Daily  . magnesium oxide  400 mg Oral Daily  . pantoprazole  40 mg Oral Daily  . pravastatin  40 mg Oral Daily  . senna  1 tablet Oral QHS  . sodium chloride flush  3 mL Intravenous Q12H  . sodium chloride flush  3 mL Intravenous Q12H  . tamsulosin  0.4 mg Oral Daily  . vitamin B-12  1,000 mcg Oral Daily   Continuous Infusions:  PRN Meds: sodium chloride, acetaminophen **OR** acetaminophen, diphenhydrAMINE, ondansetron **OR**  ondansetron (ZOFRAN) IV, polyethylene glycol, sodium chloride, sodium chloride flush   Objective: Telemetry: Normal sinus rhythm Physical Exam: Blood pressure (!) 154/56, pulse (!) 57, temperature 97.6 F (36.4 C), temperature source Oral, resp. rate 14, height 6' (1.829 m), weight 165 lb (74.8 kg), SpO2 98 %. Body mass index is 22.38 kg/m. GEN: Well nourished, well developed, in no acute distress.  HEENT: Grossly normal.  Neck: Supple, JVD 12 cm, carotid bruits, or masses. Cardiac: RRR, no murmurs, rubs, or gallops. No clubbing, cyanosis, edema.  Radials/DP/PT 2+ and equal bilaterally.  Respiratory:  Respirations regular and unlabored, clear to auscultation bilaterally. GI: Soft, nontender, nondistended, BS + x 4. MS: no deformity or atrophy. Skin: warm and dry, no rash. Neuro:  Strength and sensation are intact. Psych: AAOx3.  Normal affect.   Intake/Output Summary (Last 24 hours) at 04/03/16 1659 Last data filed at 04/03/16 0512  Gross per 24 hour  Intake              240 ml  Output              300 ml  Net              -60 ml     Labs: CBC  Recent Labs  04/01/16 0451 04/02/16 0510  WBC 5.3 5.6  HGB 10.9* 11.0*  HCT 32.5* 31.8*  MCV 88.5 87.5  PLT 172 892   Basic Metabolic Panel  Recent Labs  04/02/16 0510 04/03/16 1300  NA 135 132*  K 4.2 4.8  CL 102 98*  CO2 27 25  GLUCOSE 156* 191*  BUN 42* 45*  CREATININE 3.18* 3.09*  CALCIUM 8.4* 8.0*   Liver Function Tests No results for input(s): AST, ALT, ALKPHOS, BILITOT, PROT, ALBUMIN in the last 72 hours. No results for input(s): LIPASE, AMYLASE in the last 72 hours. Cardiac Enzymes No results for input(s): CKTOTAL, CKMB, CKMBINDEX, TROPONINI in the last 72 hours. BNP Invalid input(s): POCBNP D-Dimer No results for input(s): DDIMER in the last 72 hours. Hemoglobin A1C No results for input(s): HGBA1C in the last 72 hours. Fasting Lipid Panel No results for input(s): CHOL, HDL, LDLCALC, TRIG, CHOLHDL,  LDLDIRECT in the last 72 hours. Thyroid Function Tests No results for input(s): TSH, T4TOTAL, T3FREE, THYROIDAB in the last 72 hours.  Invalid input(s): FREET3  Weights: Filed Weights   04/02/16 0334 04/03/16 0445 04/03/16 0709  Weight: 164 lb (74.4 kg) 165 lb (74.8 kg) 165 lb (74.8 kg)     Radiology/Studies:  Dg Chest 1 View  Result Date: 03/28/2016 CLINICAL DATA:  Respiratory distress.  Emesis. EXAM: CHEST 1 VIEW COMPARISON:  None. FINDINGS: Normal heart size and pulmonary vascularity. Diffuse perihilar peribronchial thickening with diffuse interstitial pattern to the lungs. This may represent interstitial pneumonia or edema. No blunting of costophrenic angles. No pneumothorax. Mediastinal contours appear intact. Calcification of the aorta. IMPRESSION: Peribronchial thickening with diffuse interstitial pattern to the lungs suggesting interstitial pneumonia or edema. Electronically Signed   By: Lucienne Capers M.D.   On: 03/28/2016 00:25   US Renal  Result Date: 03/30/2016 CLINICAL DATA:  Acute renal failure . EXAM: RENAL / URINARY TRACT ULTRASOUND COMPLETE COMPARISON:  None. FINDINGS: Right Kidney: Length: 12.2 cm. Echogenicity within normal limits. No hydronephrosis visualized. Two simple cysts in the right kidney largest measures 2.2 cm. Left Kidney: Length: 12.8 cm. Echogenicity within normal limits. No hydronephrosis visualized. A 2 cm simple cyst is noted in the left kidney. Bladder: Appears normal for degree of bladder distention. Foley catheter is in place. IMPRESSION: No acute abnormality identified. Simple cysts noted both kidneys. No hydronephrosis or bladder distention. Foley catheter is in place. Electronically Signed   By: Marcello Moores  Register   On: 03/30/2016 09:57     Assessment and Plan  71 y.o. male  --- Non-STEMI Cardiac catheterization today, no significant stenosis warranting intervention Discussed with family Etiology of elevated troponin likely demand ischemia in the  setting of three-vessel disease We have stressed the need for aggressive lipid management, LDL less than 70 Currently on pravastatin No lipid panel available. We will order lipid panel for tomorrow morning to help guide statin dosing  --- VT On amiodarone, no further arrhythmia Given no severe stenoses on catheterization, (moderate three-vessel) Less compelling perhaps to wearLife Vest Could continue amiodarone as an outpatient 200 mg twice a day Follow-up in clinic  --- Acute systolic CHF Currently on Coreg, Lasix, hydralazine Will add long-acting nitrate  ----Hypertension Medications as above also with clonidine Recommend we add long-acting nitrates As an outpatient he can check pricing of BiDil  --- Renal failure Chronic disease, diabetic nephropathy   Total encounter time more than 25 minutes  Greater than 50% was spent in counseling and coordination of care with the patient    Signed, Esmond Plants, MD, Ph.D. [facility] 04/03/2016,  4:59 PM

## 2016-04-03 NOTE — Care Management (Addendum)
Faxed cath report to [place] per request. Patient will have home 02 assessment performed.  [facility] has accepted home health referral.  Faxed transfer forms to [facility] even though transfer is very unlikely. Patient nor his wife can remember the name of patient's pcp at the [facility].  All they can remember is it is a woman.

## 2016-04-03 NOTE — Progress Notes (Signed)
Subjective:  Patient continues to feel well with no nausea or vomiting. Eating well and drinking Nephro shakes.  Cr clearance at 34  S Cr 3.18 (2.74)  Today;s labs are pending   Objective:  Vital signs in last 24 hours:  Temp:  [97.5 F (36.4 C)-98.2 F (36.8 C)] 97.6 F (36.4 C) (02/16 1127) Pulse Rate:  [50-65] 57 (02/16 1127) Resp:  [14-19] 14 (02/16 1127) BP: (142-173)/(56-82) 154/56 (02/16 1127) SpO2:  [89 %-98 %] 98 % (02/16 1127) Weight:  [74.8 kg (165 lb)] 74.8 kg (165 lb) (02/16 0709)  Weight change: 0.272 kg (9.6 oz) Filed Weights   04/02/16 0334 04/03/16 0445 04/03/16 0709  Weight: 74.4 kg (164 lb) 74.8 kg (165 lb) 74.8 kg (165 lb)    Intake/Output:    Intake/Output Summary (Last 24 hours) at 04/03/16 1157 Last data filed at 04/03/16 0512  Gross per 24 hour  Intake              954 ml  Output              344 ml  Net              610 ml     Physical Exam: General: No acute distress, laying in the bed   HEENT Anicteric, moist oral mucous membranes   Neck Supple   Pulm/lungs Decreased breath sounds at bases   CVS/Heart Irregular, no rub   Abdomen:  Soft, nontender   Extremities: No edema, proximal and distal muscle wasting   Neurologic: Alert, oriented,   Skin: No acute rashes           Basic Metabolic Panel:   Recent Labs Lab 03/29/16 1150 03/30/16 0645 03/31/16 0535 04/01/16 0451 04/02/16 0510  NA 135 134* 135 135 135  K 4.0 4.2 4.0 3.8 4.2  CL 103 103 101 102 102  CO2 26 24 27 28 27   GLUCOSE 157* 130* 156* 160* 156*  BUN 26* 26* 31* 34* 42*  CREATININE 2.61* 2.69* 2.72* 2.74* 3.18*  CALCIUM 8.0* 8.1* 8.3* 8.2* 8.4*  MG  --   --  1.8  --   --   PHOS  --   --  3.9  --   --      CBC:  Recent Labs Lab 03/28/16 0006 03/31/16 0535 04/01/16 0451 04/02/16 0510  WBC 7.5 5.9 5.3 5.6  NEUTROABS 4.8  --   --   --   HGB 13.2 11.6* 10.9* 11.0*  HCT 40.4 33.4* 32.5* 31.8*  MCV 89.3 87.3 88.5 87.5  PLT 235 199 172 186       Microbiology:  No results found for this or any previous visit (from the past 720 hour(s)).  Coagulation Studies: No results for input(s): LABPROT, INR in the last 72 hours.  Urinalysis: No results for input(s): COLORURINE, LABSPEC, PHURINE, GLUCOSEU, HGBUR, BILIRUBINUR, KETONESUR, PROTEINUR, UROBILINOGEN, NITRITE, LEUKOCYTESUR in the last 72 hours.  Invalid input(s): APPERANCEUR    Imaging: No results found.   Medications:    . alfuzosin  10 mg Oral QHS  . amiodarone  400 mg Oral BID  . aspirin EC  81 mg Oral Daily  . carvedilol  3.125 mg Oral BID WC  . cloNIDine  0.1 mg Oral BID  . docusate sodium  100 mg Oral BID  . feeding supplement (NEPRO CARB STEADY)  237 mL Oral BID BM  . furosemide  40 mg Oral BID  . heparin subcutaneous  5,000 Units Subcutaneous  Q8H  . hydrALAZINE  100 mg Oral TID  . insulin aspart  0-15 Units Subcutaneous TID WC  . insulin aspart  0-5 Units Subcutaneous QHS  . insulin glargine  7 Units Subcutaneous QHS  . isosorbide mononitrate  60 mg Oral Daily  . magnesium oxide  400 mg Oral Daily  . pantoprazole  40 mg Oral Daily  . pravastatin  40 mg Oral Daily  . senna  1 tablet Oral QHS  . sodium chloride flush  3 mL Intravenous Q12H  . sodium chloride flush  3 mL Intravenous Q12H  . tamsulosin  0.4 mg Oral Daily  . vitamin B-12  1,000 mcg Oral Daily   sodium chloride, acetaminophen **OR** acetaminophen, diphenhydrAMINE, ondansetron **OR** ondansetron (ZOFRAN) IV, polyethylene glycol, sodium chloride, sodium chloride flush  Assessment/ Plan:  71 y.o. African-American male with  medical problems of Long-standing type 2 diabetes with severe neuropathy, cataracts, hypertension, BPH, severe systolic congestive heart failure who was admitted to [facility] on 03/28/2016 for evaluation of respiratory distress.   1. Chronic kidney disease stage 4 2. Diabetes type 2 with chronic kidney disease and severe diabetic neuropathy 3. Chronic systolic congestive  heart failure. EF 20% 4. Hypertension   Plan 1. 24 hr urine for Cr clearance resulted in 34 cc/min 2. Had coronary angiography today. Medical management recommended 3. ANA, ANCA, SPEP, neg 4.Today's lab results are pending  will continue to monitor   LOS: 6 Morning Halberg 2/16/201811:57 AM

## 2016-04-03 NOTE — Care Management Important Message (Signed)
Important Message  Patient Details  Name: Colton Thompson MRN: [id number] Date of Birth: August 08, 1945   Medicare Important Message Given:  Yes    Katrina Stack, RN 04/03/2016, 12:33 PM

## 2016-04-03 NOTE — Progress Notes (Signed)
[place] [facility] [place] NAME: Colton Thompson    MR#:  [id number]  DATE OF BIRTH:  [birth year]  SUBJECTIVE:  CHIEF COMPLAINT:   Chief Complaint  Patient presents with  . Respiratory Distress   No SOB.  Voiding well  Cath showed no significant CAD REVIEW OF SYSTEMS:    Review of Systems  Constitutional: Positive for malaise/fatigue. Negative for chills and fever.  HENT: Negative for sore throat.   Eyes: Negative for blurred vision, double vision and pain.  Respiratory: Positive for cough, sputum production and shortness of breath. Negative for hemoptysis and wheezing.   Cardiovascular: Negative for chest pain, palpitations, orthopnea and leg swelling.  Gastrointestinal: Negative for abdominal pain, constipation, diarrhea, heartburn, nausea and vomiting.  Genitourinary: Negative for dysuria and hematuria.  Musculoskeletal: Negative for back pain and joint pain.  Skin: Negative for rash.  Neurological: Positive for weakness. Negative for sensory change, speech change, focal weakness and headaches.  Endo/Heme/Allergies: Does not bruise/bleed easily.  Psychiatric/Behavioral: Negative for depression. The patient is not nervous/anxious.     DRUG ALLERGIES:  No Known Allergies  VITALS:  Blood pressure (!) 154/56, pulse (!) 57, temperature 97.6 F (36.4 C), temperature source Oral, resp. rate 14, height 6' (1.829 m), weight 74.8 kg (165 lb), SpO2 98 %.  PHYSICAL EXAMINATION:   Physical Exam  GENERAL:  71 y.o.-year-old patient lying in the bed with no acute distress.  EYES: Pupils equal, round, reactive to light and accommodation. No scleral icterus. Extraocular muscles intact.  HEENT: Head atraumatic, normocephalic. Oropharynx and nasopharynx clear.  NECK:  Supple, no jugular venous distention. No thyroid enlargement, no tenderness.  LUNGS: Normal WOB. Bibasilar crackles CARDIOVASCULAR: S1, S2 normal. No murmurs, rubs, or gallops.   ABDOMEN: Soft, nontender, nondistended. Bowel sounds present. No organomegaly or mass.  EXTREMITIES: No cyanosis, clubbing or edema b/l.    NEUROLOGIC: Cranial nerves II through XII are intact. No focal Motor or sensory deficits b/l.   PSYCHIATRIC: The patient is alert and oriented x 3.  SKIN: No obvious rash, lesion, or ulcer.   LABORATORY PANEL:   CBC  Recent Labs Lab 04/02/16 0510  WBC 5.6  HGB 11.0*  HCT 31.8*  PLT 186   ------------------------------------------------------------------------------------------------------------------ Chemistries   Recent Labs Lab 03/28/16 0006  03/31/16 0535  04/03/16 1300  NA 135  < > 135  < > 132*  K 4.3  < > 4.0  < > 4.8  CL 103  < > 101  < > 98*  CO2 24  < > 27  < > 25  GLUCOSE 187*  < > 156*  < > 191*  BUN 22*  < > 31*  < > 45*  CREATININE 2.07*  < > 2.72*  < > 3.09*  CALCIUM 8.4*  < > 8.3*  < > 8.0*  MG  --   --  1.8  --   --   AST 23  --   --   --   --   ALT 12*  --   --   --   --   ALKPHOS 56  --   --   --   --   BILITOT 0.6  --   --   --   --   < > = values in this interval not displayed. ------------------------------------------------------------------------------------------------------------------  Cardiac Enzymes  Recent Labs Lab 03/28/16 1628  TROPONINI 1.92*   ------------------------------------------------------------------------------------------------------------------  RADIOLOGY:  No results found.   ASSESSMENT AND  PLAN:   * Acute on chronic systolic chf with acute hypoxic resp failure - IV Lasix stopped due to ARF - Beta blockers. - Input and Output - Counseled to limit fluids and Salt - Monitor Bun/Cr and Potassium - Echo - EF 20-25% Will need PO lasix at discharge  * Cardiomyopathy Likely ischemic  * AKI over CKD 3 or 4 - worsening Baseline unknown. 24 hour creatinine clearance is 34 Monitor after catheterization. Repeat labs in AM  * Elevated troponin likely Demand ischemia Cath  showed moderate 3 vessel disease. Medical management  * NSVT On amiodarone Will need Lifevest at discharge. Discussed with [employer] representative.  * Accelerated HTN Improved with meds.  * DM2 SSI Increase lantus to 12 units QHS. Takes 15 at home  * Tobacco use counseled previously  * Urinary retention due to BPH On flomax Foley removed. Bladder scan PRN Reinsert foley if unable to void.  All the records are reviewed and case discussed with Care Management/Social Workerr. Management plans discussed with the patient, family and they are in agreement.  CODE STATUS: FULL CODE  DVT Prophylaxis: SCDs  TOTAL TIME TAKING CARE OF THIS PATIENT: 35 minutes.   D/C in AM per cardiology recommendations with home O2  Hillary Bow R M.D on 04/03/2016 at 3:16 PM  Between 7am to 6pm - Pager - [phone]  After 6pm go to [email or web address] - password [email or web address] [facility]  Office  [phone]  CC: Primary care physician; Pcp Not In System  Note: This dictation was prepared with Dragon dictation along with smaller phrase technology. Any transcriptional errors that result from this process are unintentional.

## 2016-04-03 NOTE — Progress Notes (Signed)
Patient clinically stable post heart cath per Dr Fletcher Anon. sb per monitor,alert and oriented,tr band intact right radial,no bleeding nor hematoma at site.for medical management, report called to [place] on telemetry (([phone]), with plan reviewed, questions answered.

## 2016-04-03 NOTE — Care Management (Addendum)
Patient qualified for home 02 if discharges over the weekend.  have requested order from MD.  Have not received approval for zoll vest yet.  Hope it will be completed later this evening and can be fitted Saturday.  Colton Thompson will contact [facility] to update over the weekend.  Updated wife and patient on plan- home health, home 02 and zoll vest.

## 2016-04-03 NOTE — Progress Notes (Signed)
SATURATION QUALIFICATIONS: (This note is used to comply with regulatory documentation for home oxygen)  Patient Saturations on Room Air at Rest = 95%  Patient Saturations on Room Air while Ambulating = 85%  Patient Saturations on 2 Liters of oxygen while Ambulating = 96%  Please briefly explain why patient needs home oxygen:

## 2016-04-03 NOTE — Interval H&P Note (Signed)
Cath Lab Visit (complete for each Cath Lab visit)  Clinical Evaluation Leading to the Procedure:   ACS: Yes.    Non-ACS:  n/a   History and Physical Interval Note:  04/03/2016 7:53 AM  Colton Thompson  has presented today for surgery, with the diagnosis of NSTEMI  The various methods of treatment have been discussed with the patient and family. After consideration of risks, benefits and other options for treatment, the patient has consented to  Procedure(s): Left Heart Cath and Coronary Angiography (N/A) as a surgical intervention .  The patient's history has been reviewed, patient examined, no change in status, stable for surgery.  I have reviewed the patient's chart and labs.  Questions were answered to the patient's satisfaction.     [name]

## 2016-04-03 NOTE — Progress Notes (Signed)
Pt is in no distress. Pt is refusing Bipap

## 2016-04-03 NOTE — Progress Notes (Signed)
Inpatient Diabetes Program Recommendations  AACE/ADA: New Consensus Statement on Inpatient Glycemic Control (2015)  Target Ranges:  Prepandial:   less than 140 mg/dL      Peak postprandial:   less than 180 mg/dL (1-2 hours)      Critically ill patients:  140 - 180 mg/dL   Lab Results  Component Value Date   GLUCAP 214 (H) 04/02/2016   HGBA1C 5.9 (H) 03/28/2016    Review of Glycemic Control  Results for THANIEL, COLUCCIO (MRN [id number]) as of 04/03/2016 14:40  Ref. Range 04/02/2016 07:54 04/02/2016 11:57 04/02/2016 16:32 04/02/2016 21:21 04/03/2016 11:24  Glucose-Capillary Latest Ref Range: 65 - 99 mg/dL 153 (H) 246 (H) 183 (H) 214 (H) 241 (H)   Diabetes history: Type 2 Outpatient Diabetes medications: Lantus 15 units qhs, Metformin 1000mg  qam, 1500mg  qpm  Current orders for Inpatient glycemic control: Novolog 0-15 units tid, Novolog 0-5 units qhs, Lantus 7 units qhs  Inpatient Diabetes Program Recommendations:  Consider increasing Lantus to 12 units qhs beginning tonight  Gentry Fitz, RN, [place], [place], CDE Diabetes Coordinator Inpatient Diabetes Program  [phone] (Team Pager) [phone] ([place]) 04/03/2016 2:42 PM

## 2016-04-03 NOTE — H&P (View-Only) (Signed)
Patient Name: Colton Thompson Date of Encounter: 04/02/2016  Primary Cardiologist: New to [facility] - consult by Rockey Situ (followed by [facility] Problem List     Active Problems:   Acute on chronic combined systolic and diastolic CHF (congestive heart failure) (HCC)   Acute renal failure superimposed on stage 3 chronic kidney disease ([place])   Dilated cardiomyopathy ([place])   Hypertensive urgency   Centrilobular emphysema (HCC)   Smoker   Pulmonary hypertension   Ischemic cardiomyopathy   ARF (acute renal failure) (HCC)   VT (ventricular tachycardia) (HCC)   Chronic renal failure, stage 4 (severe) (HCC)     Subjective   No acute overnight events. Some nasal congestion this morning. Renal function bumped this morning to 3.18 from 2.74 the day prior. Preliminarily scheduled for LHC on 2/16 with Dr. Fletcher Anon. 24 hour urine CrCl of 34.   Inpatient Medications    Scheduled Meds: . alfuzosin  10 mg Oral QHS  . amiodarone  400 mg Oral BID  . aspirin EC  81 mg Oral Daily  . carvedilol  6.25 mg Oral BID WC  . cloNIDine  0.1 mg Oral BID  . docusate sodium  100 mg Oral BID  . feeding supplement (NEPRO CARB STEADY)  237 mL Oral BID BM  . heparin subcutaneous  5,000 Units Subcutaneous Q8H  . hydrALAZINE  100 mg Oral TID  . insulin aspart  0-15 Units Subcutaneous TID WC  . insulin aspart  0-5 Units Subcutaneous QHS  . isosorbide mononitrate  60 mg Oral Daily  . magnesium oxide  400 mg Oral Daily  . pantoprazole  40 mg Oral Daily  . pravastatin  40 mg Oral Daily  . sodium chloride flush  3 mL Intravenous Q12H  . sodium chloride flush  3 mL Intravenous Q12H  . tamsulosin  0.4 mg Oral Daily  . vitamin B-12  1,000 mcg Oral Daily   Continuous Infusions: . sodium chloride     PRN Meds: sodium chloride, acetaminophen **OR** acetaminophen, diphenhydrAMINE, ondansetron **OR** ondansetron (ZOFRAN) IV, sodium chloride, sodium chloride flush   Vital Signs    Vitals:   04/01/16  1649 04/01/16 1807 04/01/16 1934 04/02/16 0334  BP:   125/68 139/66  Pulse:   (!) 57 73  Resp:   15 16  Temp:   98.7 F (37.1 C)   TempSrc:   Oral   SpO2: 97%  97% 94%  Weight:  164 lb 6.4 oz (74.6 kg)  164 lb (74.4 kg)  Height:        Intake/Output Summary (Last 24 hours) at 04/02/16 0756 Last data filed at 04/02/16 0606  Gross per 24 hour  Intake              720 ml  Output              700 ml  Net               20 ml   Filed Weights   04/01/16 0425 04/01/16 1807 04/02/16 0334  Weight: 160 lb 4.8 oz (72.7 kg) 164 lb 6.4 oz (74.6 kg) 164 lb (74.4 kg)    Physical Exam    GEN: Well nourished, well developed, in no acute distress.  HEENT: Grossly normal.  Neck: Supple, no JVD, carotid bruits, or masses. Cardiac: RRR, no murmurs, rubs, or gallops. No clubbing, cyanosis, edema.  Radials/DP/PT 2+ and equal bilaterally.  Respiratory:  Respirations regular and unlabored, clear to  auscultation bilaterally. GI: Soft, nontender, nondistended, BS + x 4. MS: no deformity or atrophy. Skin: warm and dry, no rash. Neuro:  Strength and sensation are intact. Psych: AAOx3.  Normal affect.  Labs    CBC  Recent Labs  04/01/16 0451 04/02/16 0510  WBC 5.3 5.6  HGB 10.9* 11.0*  HCT 32.5* 31.8*  MCV 88.5 87.5  PLT 172 809   Basic Metabolic Panel  Recent Labs  03/31/16 0535 04/01/16 0451 04/02/16 0510  NA 135 135 135  K 4.0 3.8 4.2  CL 101 102 102  CO2 27 28 27   GLUCOSE 156* 160* 156*  BUN 31* 34* 42*  CREATININE 2.72* 2.74* 3.18*  CALCIUM 8.3* 8.2* 8.4*  MG 1.8  --   --   PHOS 3.9  --   --    Liver Function Tests  Recent Labs  03/31/16 0535  ALBUMIN 2.9*   No results for input(s): LIPASE, AMYLASE in the last 72 hours. Cardiac Enzymes No results for input(s): CKTOTAL, CKMB, CKMBINDEX, TROPONINI in the last 72 hours. BNP Invalid input(s): POCBNP D-Dimer No results for input(s): DDIMER in the last 72 hours. Hemoglobin A1C No results for input(s): HGBA1C in the  last 72 hours. Fasting Lipid Panel No results for input(s): CHOL, HDL, LDLCALC, TRIG, CHOLHDL, LDLDIRECT in the last 72 hours. Thyroid Function Tests No results for input(s): TSH, T4TOTAL, T3FREE, THYROIDAB in the last 72 hours.  Invalid input(s): FREET3  Telemetry    Sinus bradycardia, 50's bpm - Personally Reviewed  ECG    n/a - Personally Reviewed  Radiology    No results found.  Cardiac Studies   Echo 03/28/16: Study Conclusions  - Left ventricle: The cavity size was moderately dilated. Systolic function was severely reduced. The estimated ejection fraction was in the range of 20% to 25%. Diffuse hypokinesis. Regional wall motion abnormalities cannot be excluded. Findings consistent with left ventricular diastolic dysfunction. - Mitral valve: There was mild to moderate regurgitation. - Left atrium: The atrium was mildly dilated. - Right ventricle: Systolic function was normal. - Tricuspid valve: There was mild-moderate regurgitation. - Pulmonary arteries: Systolic pressure was severely elevated. PA peak pressure: 85 mm Hg (S).  Patient Profile     71 y.o. male with acute shortness of breath, prior history of CHF per his report, managed at the [place] ,renal failure withcreatinine 2.0 , ejection fraction 20 to 25% , long history of smoking who continues to smoke, chest x-ray concerning for edema, troponin up to 1.9and the setting of severe hypertension on arrival 225/125.  Assessment & Plan    1. NSTEMI: -Troponin peak of 1.9 -Chest pain free -Status post heparin gtt for medical management  -High suspicion of CAD given 50 years of smoking with reduced EF of 20-25% -Have been unable to pursue cardiac cath to date given elevated renal function that continues to worsen -Renal on board, with CrCl of 34 mL/min -Serum creatinine bumped this morning to 3.18 from 2.74 -For LHC with limited contrast on 2/16, pending trend of renal function -Will need IV  hydration overnight -Discuss with renal regarding timing and amount of IV hydration -For now, continue, Imdur, Coreg, and statin -Aspirin 81 mg daily  2. Acute on chronic systolic CHF: -EF 98-33% -BNP elevated at 1900 -Lasix on hold given renal function -Amlodipine stopped given cardiomyopathy, titrate Imdur -Continue Coreg, Imdur/hydralazine  -Not on ACEi/ARB/spiro/Entresto given renal disease  3. Acute on CKD stage III: -Renal on board -As above  4. Hypertensive urgency: -Improved -  Continue current medications  5. Pulmonary hypertension: -Will need R/LHC  -As above  6. NSVT: -No further ventricular runs -Continue PO amiodarone loading, if he has further ectopy may need to start IV amiodarone load -Coreg as above -May need LifeVest prior to discharge given EF and ventricular ectopy, paperwork has been started -Add magnesium oxide 400 mg daily   Signed, Marcille Blanco [place] Pager: [phone] 04/02/2016, 7:56 AM   Attending Note Patient seen and examined, agree with detailed note above,  Patient presentation and plan discussed on rounds.   SOB with exertion,  He does not feel as well, less appetite Discussed need for cardiac catheterization, he is willing to proceed tomorrow We did discuss his renal function with him, creatinine now up to greater than 3 Likely trending towards dialysis Recommendation made by nephrology for staged procedure to minimize contrast exposure  On clinical exam lungs with dullness at the bases, otherwise mild scattered Rales, heart sounds regular with S4, abdomen soft nontender, no significant lower extremity edema  --- Non-STEMI Cardiac catheterization scheduled tomorrow morning Recommend staged procedure if possible given underlying renal failure Nephrology aware Need to proceed given heart failure symptoms, likely underlying ischemic heart disease I have reviewed the risks, indications, and alternatives to  cardiac catheterization, possible angioplasty, and stenting with the patient. Risks include but are not limited to bleeding, infection, vascular injury, stroke, myocardial infection, arrhythmia, kidney injury, radiation-related injury in the case of prolonged fluoroscopy use, emergency cardiac surgery, and death. The patient understands the risks of serious complication is 1-2 in 0865 with diagnostic cardiac cath and 1-2% or less with angioplasty/stenting.   --- VT On amiodarone, no further arrhythmia  --- Renal failure Chronic disease, diabetic nephropathy  Discussed case with nursing, Dr. Fletcher Anon who will be performing catheterization tomorrow, nephrology  Greater than 50% was spent in counseling and coordination of care with patient Total encounter time 35 minutes or more   Signed: Esmond Plants  M.D., Ph.D. [facility]

## 2016-04-03 NOTE — Progress Notes (Signed)
Tr band off, no bleeding nor hematoma visible. Elevated on pillow. sr per monitor, denies complaints, ate sandwich, tolerated well.

## 2016-04-03 NOTE — Progress Notes (Signed)
Patient back from cath. Site is clean and intact with no evidence of hematoma. Patient instructed to keep wrist elevated and to have minimal movement for today. Vitals are stable, BP a little elevated, but morning meds were given now. No complaints of pain. Patient came to floor with fluids hanging. Spoke with Dr. Darvin Neighbours and MD stated patient did not have fluids before cath and does not need them post cath. Will make sure patient drinks some water and urinates well today after foley catheter was removed yesterday. Patient states he used the restroom twice after the procedure in specials.

## 2016-04-04 LAB — RENAL FUNCTION PANEL
ANION GAP: 6 (ref 5–15)
Albumin: 2.7 g/dL — ABNORMAL LOW (ref 3.5–5.0)
BUN: 44 mg/dL — ABNORMAL HIGH (ref 6–20)
CALCIUM: 8.3 mg/dL — AB (ref 8.9–10.3)
CO2: 27 mmol/L (ref 22–32)
Chloride: 100 mmol/L — ABNORMAL LOW (ref 101–111)
Creatinine, Ser: 3.18 mg/dL — ABNORMAL HIGH (ref 0.61–1.24)
GFR, EST AFRICAN AMERICAN: 21 mL/min — AB (ref >60)
GFR, EST NON AFRICAN AMERICAN: 18 mL/min — AB (ref >60)
Glucose, Bld: 173 mg/dL — ABNORMAL HIGH (ref 65–99)
PHOSPHORUS: 4.4 mg/dL (ref 2.5–4.6)
Potassium: 4.5 mmol/L (ref 3.5–5.1)
SODIUM: 133 mmol/L — AB (ref 135–145)

## 2016-04-04 LAB — GLUCOSE, CAPILLARY
Glucose-Capillary: 167 mg/dL — ABNORMAL HIGH (ref 65–99)
Glucose-Capillary: 133 mg/dL — ABNORMAL HIGH (ref 65–99)

## 2016-04-04 LAB — LIPID PANEL
CHOLESTEROL: 128 mg/dL (ref 0–200)
HDL: 45 mg/dL (ref >40)
LDL CALC: 70 mg/dL (ref 0–99)
TRIGLYCERIDES: 65 mg/dL (ref <150)
Total CHOL/HDL Ratio: 2.8 RATIO
VLDL: 13 mg/dL (ref 0–40)

## 2016-04-04 MED ORDER — CLONIDINE HCL 0.1 MG PO TABS: 0.1000 mg | ORAL_TABLET | Freq: Two times a day (BID) | ORAL | 11 refills | Status: DC

## 2016-04-04 MED ORDER — CARVEDILOL 6.25 MG PO TABS: 6.2500 mg | ORAL_TABLET | Freq: Two times a day (BID) | ORAL | Status: DC

## 2016-04-04 MED ORDER — CARVEDILOL 6.25 MG PO TABS: 6.2500 mg | ORAL_TABLET | Freq: Two times a day (BID) | ORAL | 0 refills | Status: DC

## 2016-04-04 MED ORDER — ASPIRIN 81 MG PO TBEC
81.0000 mg | DELAYED_RELEASE_TABLET | Freq: Every day | ORAL | 0 refills | Status: AC
Start: 2016-04-05 — End: ?

## 2016-04-04 MED ORDER — ISOSORBIDE MONONITRATE ER 60 MG PO TB24: 60.0000 mg | ORAL_TABLET | Freq: Two times a day (BID) | ORAL | 0 refills | Status: DC

## 2016-04-04 MED ORDER — TAMSULOSIN HCL 0.4 MG PO CAPS: 0.4000 mg | ORAL_CAPSULE | Freq: Every day | ORAL | 0 refills | Status: AC

## 2016-04-04 MED ORDER — AMIODARONE HCL 200 MG PO TABS: 200.0000 mg | ORAL_TABLET | Freq: Two times a day (BID) | ORAL | 0 refills | Status: DC

## 2016-04-04 NOTE — Progress Notes (Signed)
Subjective:  Patient continues to feel well with no nausea or vomiting. Eating well and drinking Nephro shakes.  Cr clearance at 34  S Cr 3.18 / GFR 21   Objective:  Vital signs in last 24 hours:  Temp:  [98.1 F (36.7 C)-98.4 F (36.9 C)] 98.4 F (36.9 C) (02/17 1116) Pulse Rate:  [55-77] 67 (02/17 1116) Resp:  [16-18] 16 (02/17 1116) BP: (142-166)/(57-76) 142/62 (02/17 1116) SpO2:  [92 %-95 %] 93 % (02/17 1116) Weight:  [75.1 kg (165 lb 8 oz)] 75.1 kg (165 lb 8 oz) (02/17 0401)  Weight change: 0 kg (0 lb) Filed Weights   04/03/16 0445 04/03/16 0709 04/04/16 0401  Weight: 74.8 kg (165 lb) 74.8 kg (165 lb) 75.1 kg (165 lb 8 oz)    Intake/Output:    Intake/Output Summary (Last 24 hours) at 04/04/16 1429 Last data filed at 04/04/16 1351  Gross per 24 hour  Intake              480 ml  Output             1395 ml  Net             -915 ml     Physical Exam: General: No acute distress, laying in the bed   HEENT Anicteric, moist oral mucous membranes   Neck Supple   Pulm/lungs Decreased breath sounds at bases   CVS/Heart Irregular, no rub   Abdomen:  Soft, nontender   Extremities: No edema, proximal and distal muscle wasting   Neurologic: Alert, oriented,   Skin: No acute rashes           Basic Metabolic Panel:   Recent Labs Lab 03/31/16 0535 04/01/16 0451 04/02/16 0510 04/03/16 1300 04/04/16 0027  NA 135 135 135 132* 133*  K 4.0 3.8 4.2 4.8 4.5  CL 101 102 102 98* 100*  CO2 27 28 27 25 27   GLUCOSE 156* 160* 156* 191* 173*  BUN 31* 34* 42* 45* 44*  CREATININE 2.72* 2.74* 3.18* 3.09* 3.18*  CALCIUM 8.3* 8.2* 8.4* 8.0* 8.3*  MG 1.8  --   --   --   --   PHOS 3.9  --   --   --  4.4     CBC:  Recent Labs Lab 03/31/16 0535 04/01/16 0451 04/02/16 0510  WBC 5.9 5.3 5.6  HGB 11.6* 10.9* 11.0*  HCT 33.4* 32.5* 31.8*  MCV 87.3 88.5 87.5  PLT 199 172 186      Microbiology:  No results found for this or any previous visit (from the past 720  hour(s)).  Coagulation Studies: No results for input(s): LABPROT, INR in the last 72 hours.  Urinalysis: No results for input(s): COLORURINE, LABSPEC, PHURINE, GLUCOSEU, HGBUR, BILIRUBINUR, KETONESUR, PROTEINUR, UROBILINOGEN, NITRITE, LEUKOCYTESUR in the last 72 hours.  Invalid input(s): APPERANCEUR    Imaging: No results found.   Medications:    . alfuzosin  10 mg Oral QHS  . amiodarone  200 mg Oral BID  . aspirin EC  81 mg Oral Daily  . carvedilol  6.25 mg Oral BID WC  . cloNIDine  0.1 mg Oral BID  . docusate sodium  100 mg Oral BID  . feeding supplement (NEPRO CARB STEADY)  237 mL Oral BID BM  . furosemide  40 mg Oral BID  . heparin subcutaneous  5,000 Units Subcutaneous Q8H  . hydrALAZINE  100 mg Oral TID  . insulin aspart  0-15 Units Subcutaneous TID WC  .  insulin aspart  0-5 Units Subcutaneous QHS  . insulin glargine  12 Units Subcutaneous QHS  . isosorbide mononitrate  60 mg Oral BID  . magnesium oxide  400 mg Oral Daily  . pantoprazole  40 mg Oral Daily  . pravastatin  40 mg Oral Daily  . senna  1 tablet Oral QHS  . sodium chloride flush  3 mL Intravenous Q12H  . sodium chloride flush  3 mL Intravenous Q12H  . tamsulosin  0.4 mg Oral Daily  . vitamin B-12  1,000 mcg Oral Daily   sodium chloride, acetaminophen **OR** acetaminophen, diphenhydrAMINE, ondansetron **OR** ondansetron (ZOFRAN) IV, polyethylene glycol, sodium chloride, sodium chloride flush  Assessment/ Plan:  71 y.o. African-American male with  medical problems of Long-standing type 2 diabetes with severe neuropathy, cataracts, hypertension, BPH, severe systolic congestive heart failure who was admitted to [facility] on 03/28/2016 for evaluation of respiratory distress.   1. Chronic kidney disease stage 4 2. Diabetes type 2 with chronic kidney disease and severe diabetic neuropathy 3. Chronic systolic congestive heart failure. EF 20% 4. Hypertension   Plan 1. 24 hr urine for Cr clearance resulted in  34 cc/min 2. Had coronary angiography today. Medical management recommended 3. ANA, ANCA, SPEP, neg 4. Tolerating ACE-I well. BP is better. This Cr might be his new baseline   LOS: 7 Haylee Mcanany 2/17/20182:29 PM

## 2016-04-04 NOTE — Progress Notes (Signed)
Discharge instructions along with home medication list, vascular instructions and follow up gone over with patient and wife. Both verbalized that they understood instructions. Printed prescriptions given with patient. Patient wearing life vest. No c/o pain no distress noted. Iv removed x2. Telemetry removed. Right radial dressing clean dry and intact. Patient to discharged home on ra.

## 2016-04-04 NOTE — Discharge Instructions (Signed)
Heart Failure Clinic appointment on April 08, 2016 at 9:00am with Darylene Price, [place]. Please call [phone] to reschedule.   Follow with nephrology clinic in 1 week to check renal function.

## 2016-04-04 NOTE — Care Management Note (Addendum)
Case Management Note  Patient Details  Name: Colton Thompson MRN: [id number] Date of Birth: 13-Dec-1945  Subjective/Objective:     Mr Colton Thompson was fitted with a Foard this morning  And Colton Thompson at [place] has been notified of resumption of HH-Rn and PT services order. Mr Colton Thompson has been on room air today and has not qualified for home oxygen per his nurse. No other needs identified.              Action/Plan:   Expected Discharge Date:  04/04/16               Expected Discharge Plan:  [place]  In-House Referral:     Discharge planning Services  CM Consult, HF Clinic  Post Acute Care Choice:    Choice offered to:     DME Arranged:    DME Agency:     HH Arranged:    HH Agency:     Status of Service:     If discussed at [employer], dates discussed:    Additional Comments:  Chantia Amalfitano A, RN 04/04/2016, 2:14 PM

## 2016-04-04 NOTE — Progress Notes (Signed)
Spoke with dr. Anselm Jungling regarding worsening creat. Of 3.18. Per md hold am lasix will continue to monitor

## 2016-04-04 NOTE — Discharge Summary (Signed)
[place] [facility] [place] NAME: Colton Thompson    MR#:  [id number]  DATE OF BIRTH:  1945-04-05  DATE OF ADMISSION:  03/28/2016 ADMITTING PHYSICIAN: Harrie Foreman, MD  DATE OF DISCHARGE: 04/04/2016  PRIMARY CARE PHYSICIAN: Pcp Not In System    ADMISSION DIAGNOSIS:  Diff Breathing NSTEMI  DISCHARGE DIAGNOSIS:  Active Problems:   Acute on chronic combined systolic and diastolic CHF (congestive heart failure) (HCC)   Acute renal failure superimposed on stage 3 chronic kidney disease ([place])   Dilated cardiomyopathy (HCC)   Hypertensive urgency   Centrilobular emphysema (HCC)   Smoker   Pulmonary hypertension   Ischemic cardiomyopathy   ARF (acute renal failure) (HCC)   VT (ventricular tachycardia) (HCC)   Chronic renal failure, stage 4 (severe) ([place])   SECONDARY DIAGNOSIS:   Past Medical History:  Diagnosis Date  . CHF (congestive heart failure) ([place])   . DM (diabetes mellitus), type 2 ([place])   . HTN (hypertension)     HOSPITAL COURSE:   * Acute on chronic systolic chf with acute hypoxic resp failure - IV Lasix stopped due to ARF - Beta blockers. - Input and Output - Counseled to limit fluids and Salt - Monitor Bun/Cr and Potassium - Echo - EF 20-25% Will need PO lasix at discharge  stable now.  * Cardiomyopathy Likely ischemic  * AKI over CKD 3 or 4 - worsening Baseline unknown. 24 hour creatinine clearance is 34 Monitor after catheterization. Repeat labs - stable creatinin. Nephro suggest to follow in clinic in 1 week.  * Elevated troponin likely [place]and ischemia Cath showed moderate 3 vessel disease. Medical management   Already on optimal therapy, betablocker, amiodarone, ASA.   No candidate for ACE inhibitor at this time due to renal failure.  * NSVT On amiodarone Will need Lifevest at discharge. Discussed with [employer] representative.  pt received aand installed life vest today.  * Accelerated  HTN Improved with meds.  * DM2 SSI Increase lantus to 12 units QHS. Takes 15 at home  * Tobacco use counseled previously  * Urinary retention due to BPH On flomax Foley removed. Bladder scan PRN Reinsert foley if unable to void.   DISCHARGE CONDITIONS:   Stable.  CONSULTS OBTAINED:  Treatment Team:  Minna Merritts, MD Lavonia Dana, MD  DRUG ALLERGIES:  No Known Allergies  DISCHARGE MEDICATIONS:   Current Discharge Medication List    START taking these medications   Details  amiodarone (PACERONE) 200 MG tablet Take 1 tablet (200 mg total) by mouth 2 (two) times daily. Qty: 60 tablet, Refills: 0    aspirin EC 81 MG EC tablet Take 1 tablet (81 mg total) by mouth daily. Qty: 30 tablet, Refills: 0    carvedilol (COREG) 6.25 MG tablet Take 1 tablet (6.25 mg total) by mouth 2 (two) times daily with a meal. Qty: 60 tablet, Refills: 0    cloNIDine (CATAPRES) 0.1 MG tablet Take 1 tablet (0.1 mg total) by mouth 2 (two) times daily. Qty: 60 tablet, Refills: 11    isosorbide mononitrate (IMDUR) 60 MG 24 hr tablet Take 1 tablet (60 mg total) by mouth 2 (two) times daily. Qty: 30 tablet, Refills: 0    tamsulosin (FLOMAX) 0.4 MG CAPS capsule Take 1 capsule (0.4 mg total) by mouth daily. Qty: 30 capsule, Refills: 0      CONTINUE these medications which have NOT CHANGED   Details  albuterol (PROVENTIL HFA;VENTOLIN HFA) 108 (90  Base) MCG/ACT inhaler Inhale 1-2 puffs into the lungs every 6 (six) hours as needed for wheezing or shortness of breath.    alfuzosin (UROXATRAL) 10 MG 24 hr tablet Take 10 mg by mouth at bedtime.    furosemide (LASIX) 20 MG tablet Take 20 mg by mouth daily.    hydrALAZINE (APRESOLINE) 100 MG tablet Take 100 mg by mouth 3 (three) times daily.    insulin glargine (LANTUS) 100 UNIT/ML injection Inject 15 Units into the skin at bedtime.    omeprazole (PRILOSEC) 20 MG capsule Take 20 mg by mouth 2 (two) times daily before a meal.     pravastatin (PRAVACHOL) 40 MG tablet Take 40 mg by mouth daily.    vitamin B-12 (CYANOCOBALAMIN) 1000 MCG tablet Take 1,000 mcg by mouth daily.      STOP taking these medications     enalapril (VASOTEC) 20 MG tablet      metFORMIN (GLUCOPHAGE) 1000 MG tablet          DISCHARGE INSTRUCTIONS:    Follow with cardiology clinic in 1 week. Follow with nephrology clinic in 1 week.  If you experience worsening of your admission symptoms, develop shortness of breath, life threatening emergency, suicidal or homicidal thoughts you must seek medical attention immediately by calling 911 or calling your MD immediately  if symptoms less severe.  You Must read complete instructions/literature along with all the possible adverse reactions/side effects for all the Medicines you take and that have been prescribed to you. Take any new Medicines after you have completely understood and accept all the possible adverse reactions/side effects.   Please note  You were cared for by a hospitalist during your hospital stay. If you have any questions about your discharge medications or the care you received while you were in the hospital after you are discharged, you can call the unit and asked to speak with the hospitalist on call if the hospitalist that took care of you is not available. Once you are discharged, your primary care physician will handle any further medical issues. Please note that NO REFILLS for any discharge medications will be authorized once you are discharged, as it is imperative that you return to your primary care physician (or establish a relationship with a primary care physician if you do not have one) for your aftercare needs so that they can reassess your need for medications and monitor your lab values.    Today   CHIEF COMPLAINT:   Chief Complaint  Patient presents with  . Respiratory Distress    HISTORY OF PRESENT ILLNESS:  Colton Thompson  is a 71 y.o. male with a known  history of congestive heart failure, hypertension and diabetes presents emergency department due to shortness of breath. The patient states that he was unable to catch his breath as he usually is able to rest. He has had orthopnea for quite some time and sleeps in a recliner. Tonight his dyspnea became too great and he was transported to the emergency department. He was immediately placed on BiPAP and given Lasix IV which improved his respiratory effort. The patient reports having a CPAP machine but is not consistent with use as it is uncomfortable while sleeping. The patient denies chest pain, nausea, vomiting or diaphoresis. Once the patient was stabilized in the emergency department the hospitalist service was called for admission.    VITAL SIGNS:  Blood pressure (!) 142/62, pulse 67, temperature 98.4 F (36.9 C), temperature source Oral, resp. rate 16, height 6' (  1.829 m), weight 75.1 kg (165 lb 8 oz), SpO2 93 %.  I/O:   Intake/Output Summary (Last 24 hours) at 04/04/16 1345 Last data filed at 04/04/16 1239  Gross per 24 hour  Intake              480 ml  Output             1395 ml  Net             -915 ml    PHYSICAL EXAMINATION:   GENERAL:  71 y.o.-year-old patient lying in the bed with no acute distress.  EYES: Pupils equal, round, reactive to light and accommodation. No scleral icterus. Extraocular muscles intact.  HEENT: Head atraumatic, normocephalic. Oropharynx and nasopharynx clear.  NECK:  Supple, no jugular venous distention. No thyroid enlargement, no tenderness.  LUNGS: Normal WOB. Bibasilar crackles CARDIOVASCULAR: S1, S2 normal. No murmurs, rubs, or gallops.  ABDOMEN: Soft, nontender, nondistended. Bowel sounds present. No organomegaly or mass.  EXTREMITIES: No cyanosis, clubbing or edema b/l.    NEUROLOGIC: Cranial nerves II through XII are intact. No focal Motor or sensory deficits b/l.   PSYCHIATRIC: The patient is alert and oriented x 3.  SKIN: No obvious rash,  lesion, or ulcer.   DATA REVIEW:   CBC  Recent Labs Lab 04/02/16 0510  WBC 5.6  HGB 11.0*  HCT 31.8*  PLT 186    Chemistries   Recent Labs Lab 03/31/16 0535  04/04/16 0027  NA 135  < > 133*  K 4.0  < > 4.5  CL 101  < > 100*  CO2 27  < > 27  GLUCOSE 156*  < > 173*  BUN 31*  < > 44*  CREATININE 2.72*  < > 3.18*  CALCIUM 8.3*  < > 8.3*  MG 1.8  --   --   < > = values in this interval not displayed.  Cardiac Enzymes  Recent Labs Lab 03/28/16 1628  TROPONINI 1.92*    Microbiology Results  No results found for this or any previous visit.  RADIOLOGY:  No results found.  EKG:   Orders placed or performed during the hospital encounter of 03/28/16  . EKG 12-Lead  . EKG 12-Lead      Management plans discussed with the patient, family and they are in agreement.  CODE STATUS:     Code Status Orders        Start     Ordered   03/28/16 0439  Full code  Continuous     03/28/16 0438    Code Status History    Date Active Date Inactive Code Status Order ID Comments User Context   This patient has a current code status but no historical code status.      TOTAL TIME TAKING CARE OF THIS PATIENT: 35 minutes.    Vaughan Basta M.D on 04/04/2016 at 1:45 PM  Between 7am to 6pm - Pager - [phone]  After 6pm go to [email or web address] - password [email or web address] [place] [facility]  Office  [phone]  CC: Primary care physician; Pcp Not In System   Note: This dictation was prepared with Dragon dictation along with smaller phrase technology. Any transcriptional errors that result from this process are unintentional.

## 2016-04-04 NOTE — Progress Notes (Signed)
Patient: Colton Thompson / Admit Date: 03/28/2016 / Date of Encounter: 04/04/2016, 1:08 PM   Hospital Problem List     Active Problems:   Acute on chronic combined systolic and diastolic CHF (congestive heart failure) (HCC)   Acute renal failure superimposed on stage 3 chronic kidney disease ([place])   Dilated cardiomyopathy (HCC)   Hypertensive urgency   Centrilobular emphysema (HCC)   Smoker   Pulmonary hypertension   Ischemic cardiomyopathy   ARF (acute renal failure) (HCC)   VT (ventricular tachycardia) (HCC)   Chronic renal failure, stage 4 (severe) (HCC)     Subjective   Cardiac catheterization left and right performed yesterday 60-70% lesions, three-vessel disease noted  Blood pressure continues to run high , heart rate in the 50s    Inpatient Medications    Scheduled Meds: . alfuzosin  10 mg Oral QHS  . amiodarone  200 mg Oral BID  . aspirin EC  81 mg Oral Daily  . carvedilol  3.125 mg Oral BID WC  . cloNIDine  0.1 mg Oral BID  . docusate sodium  100 mg Oral BID  . feeding supplement (NEPRO CARB STEADY)  237 mL Oral BID BM  . furosemide  40 mg Oral BID  . heparin subcutaneous  5,000 Units Subcutaneous Q8H  . hydrALAZINE  100 mg Oral TID  . insulin aspart  0-15 Units Subcutaneous TID WC  . insulin aspart  0-5 Units Subcutaneous QHS  . insulin glargine  12 Units Subcutaneous QHS  . isosorbide mononitrate  60 mg Oral BID  . magnesium oxide  400 mg Oral Daily  . pantoprazole  40 mg Oral Daily  . pravastatin  40 mg Oral Daily  . senna  1 tablet Oral QHS  . sodium chloride flush  3 mL Intravenous Q12H  . sodium chloride flush  3 mL Intravenous Q12H  . tamsulosin  0.4 mg Oral Daily  . vitamin B-12  1,000 mcg Oral Daily   Continuous Infusions:  PRN Meds: sodium chloride, acetaminophen **OR** acetaminophen, diphenhydrAMINE, ondansetron **OR** ondansetron (ZOFRAN) IV, polyethylene glycol, sodium chloride, sodium chloride flush   Objective: Telemetry: Normal  sinus rhythm Physical Exam: Blood pressure (!) 142/62, pulse 67, temperature 98.4 F (36.9 C), temperature source Oral, resp. rate 16, height 6' (1.829 m), weight 165 lb 8 oz (75.1 kg), SpO2 93 %. Body mass index is 22.45 kg/m. GEN: Well nourished, well developed, in no acute distress.  HEENT: Grossly normal.  Neck: Supple, JVD 12 cm, carotid bruits, or masses. Cardiac: RRR, no murmurs, rubs, or gallops. No clubbing, cyanosis, edema.  Radials/DP/PT 2+ and equal bilaterally.  Respiratory:  Respirations regular and unlabored, clear to auscultation bilaterally. GI: Soft, nontender, nondistended, BS + x 4. MS: no deformity or atrophy. Skin: warm and dry, no rash. Neuro:  Strength and sensation are intact. Psych: AAOx3.  Normal affect.   Intake/Output Summary (Last 24 hours) at 04/04/16 1308 Last data filed at 04/04/16 1239  Gross per 24 hour  Intake              480 ml  Output             1395 ml  Net             -915 ml     Labs: CBC  Recent Labs  04/02/16 0510  WBC 5.6  HGB 11.0*  HCT 31.8*  MCV 87.5  PLT 195   Basic Metabolic Panel  Recent Labs  04/03/16  1300 04/04/16 0027  NA 132* 133*  K 4.8 4.5  CL 98* 100*  CO2 25 27  GLUCOSE 191* 173*  BUN 45* 44*  CREATININE 3.09* 3.18*  CALCIUM 8.0* 8.3*  PHOS  --  4.4   Liver Function Tests  Recent Labs  04/04/16 0027  ALBUMIN 2.7*   No results for input(s): LIPASE, AMYLASE in the last 72 hours. Cardiac Enzymes No results for input(s): CKTOTAL, CKMB, CKMBINDEX, TROPONINI in the last 72 hours. BNP Invalid input(s): POCBNP D-Dimer No results for input(s): DDIMER in the last 72 hours. Hemoglobin A1C No results for input(s): HGBA1C in the last 72 hours. Fasting Lipid Panel  Recent Labs  04/04/16 0015  CHOL 128  HDL 45  LDLCALC 70  TRIG 65  CHOLHDL 2.8   Thyroid Function Tests No results for input(s): TSH, T4TOTAL, T3FREE, THYROIDAB in the last 72 hours.  Invalid input(s): FREET3  Weights: Filed  Weights   04/03/16 0445 04/03/16 0709 04/04/16 0401  Weight: 165 lb (74.8 kg) 165 lb (74.8 kg) 165 lb 8 oz (75.1 kg)     Radiology/Studies:  Dg Chest 1 View  Result Date: 03/28/2016 CLINICAL DATA:  Respiratory distress.  Emesis. EXAM: CHEST 1 VIEW COMPARISON:  None. FINDINGS: Normal heart size and pulmonary vascularity. Diffuse perihilar peribronchial thickening with diffuse interstitial pattern to the lungs. This may represent interstitial pneumonia or edema. No blunting of costophrenic angles. No pneumothorax. Mediastinal contours appear intact. Calcification of the aorta. IMPRESSION: Peribronchial thickening with diffuse interstitial pattern to the lungs suggesting interstitial pneumonia or edema. Electronically Signed   By: Lucienne Capers M.D.   On: 03/28/2016 00:25   US Renal  Result Date: 03/30/2016 CLINICAL DATA:  Acute renal failure . EXAM: RENAL / URINARY TRACT ULTRASOUND COMPLETE COMPARISON:  None. FINDINGS: Right Kidney: Length: 12.2 cm. Echogenicity within normal limits. No hydronephrosis visualized. Two simple cysts in the right kidney largest measures 2.2 cm. Left Kidney: Length: 12.8 cm. Echogenicity within normal limits. No hydronephrosis visualized. A 2 cm simple cyst is noted in the left kidney. Bladder: Appears normal for degree of bladder distention. Foley catheter is in place. IMPRESSION: No acute abnormality identified. Simple cysts noted both kidneys. No hydronephrosis or bladder distention. Foley catheter is in place. Electronically Signed   By: Marcello Moores  Register   On: 03/30/2016 09:57     Assessment and Plan  71 y.o. male  --- Non-STEMI Cardiac catheterization today, no significant stenosis warranting intervention Discussed with family Etiology of elevated troponin likely demand ischemia in the setting of three-vessel disease LDL 70 Continue pravastatin   --- VT On amiodarone, no further arrhythmia Given no severe stenoses on catheterization, (moderate  three-vessel) LifeVest in place Could continue amiodarone as an outpatient 200 mg twice a day Follow-up in clinic  --- Acute systolic CHF Currently on Coreg, Lasix, hydralazine Ronaldo Crilly add long-acting nitrate  ----Hypertension Medications as above also with clonidine Recommend we add long-acting nitrates As an outpatient he can check pricing of BiDil  --- Renal failure Chronic disease, diabetic nephropathy  Cardiology to sign off  Signed, Allegra Lai, MD [facility] 04/04/2016, 1:08 PM

## 2016-04-06 DIAGNOSIS — I420 Dilated cardiomyopathy: Secondary | ICD-10-CM | POA: Diagnosis not present

## 2016-04-06 DIAGNOSIS — I472 Ventricular tachycardia: Secondary | ICD-10-CM | POA: Diagnosis not present

## 2016-04-06 DIAGNOSIS — I272 Pulmonary hypertension, unspecified: Secondary | ICD-10-CM | POA: Diagnosis not present

## 2016-04-06 DIAGNOSIS — N400 Enlarged prostate without lower urinary tract symptoms: Secondary | ICD-10-CM | POA: Diagnosis not present

## 2016-04-06 DIAGNOSIS — I5043 Acute on chronic combined systolic (congestive) and diastolic (congestive) heart failure: Secondary | ICD-10-CM | POA: Diagnosis not present

## 2016-04-06 DIAGNOSIS — Z720 Tobacco use: Secondary | ICD-10-CM | POA: Diagnosis not present

## 2016-04-06 DIAGNOSIS — Z794 Long term (current) use of insulin: Secondary | ICD-10-CM | POA: Diagnosis not present

## 2016-04-06 DIAGNOSIS — E785 Hyperlipidemia, unspecified: Secondary | ICD-10-CM | POA: Diagnosis not present

## 2016-04-06 DIAGNOSIS — I214 Non-ST elevation (NSTEMI) myocardial infarction: Secondary | ICD-10-CM | POA: Diagnosis not present

## 2016-04-06 DIAGNOSIS — E1122 Type 2 diabetes mellitus with diabetic chronic kidney disease: Secondary | ICD-10-CM | POA: Diagnosis not present

## 2016-04-06 DIAGNOSIS — J432 Centrilobular emphysema: Secondary | ICD-10-CM | POA: Diagnosis not present

## 2016-04-06 DIAGNOSIS — N184 Chronic kidney disease, stage 4 (severe): Secondary | ICD-10-CM | POA: Diagnosis not present

## 2016-04-06 DIAGNOSIS — I130 Hypertensive heart and chronic kidney disease with heart failure and stage 1 through stage 4 chronic kidney disease, or unspecified chronic kidney disease: Secondary | ICD-10-CM | POA: Diagnosis not present

## 2016-04-07 DIAGNOSIS — I5043 Acute on chronic combined systolic (congestive) and diastolic (congestive) heart failure: Secondary | ICD-10-CM | POA: Diagnosis not present

## 2016-04-07 DIAGNOSIS — I214 Non-ST elevation (NSTEMI) myocardial infarction: Secondary | ICD-10-CM | POA: Diagnosis not present

## 2016-04-07 DIAGNOSIS — I130 Hypertensive heart and chronic kidney disease with heart failure and stage 1 through stage 4 chronic kidney disease, or unspecified chronic kidney disease: Secondary | ICD-10-CM | POA: Diagnosis not present

## 2016-04-07 DIAGNOSIS — E1122 Type 2 diabetes mellitus with diabetic chronic kidney disease: Secondary | ICD-10-CM | POA: Diagnosis not present

## 2016-04-07 DIAGNOSIS — N184 Chronic kidney disease, stage 4 (severe): Secondary | ICD-10-CM | POA: Diagnosis not present

## 2016-04-07 DIAGNOSIS — I472 Ventricular tachycardia: Secondary | ICD-10-CM | POA: Diagnosis not present

## 2016-04-08 ENCOUNTER — Ambulatory Visit: Payer: Medicare Other | Admitting: Family

## 2016-04-08 ENCOUNTER — Encounter: Payer: Self-pay | Admitting: Family

## 2016-04-08 VITALS — BP 131/62 | HR 53 | Resp 18 | Ht 72.0 in | Wt 171.2 lb

## 2016-04-08 DIAGNOSIS — Z794 Long term (current) use of insulin: Secondary | ICD-10-CM | POA: Insufficient documentation

## 2016-04-08 DIAGNOSIS — I214 Non-ST elevation (NSTEMI) myocardial infarction: Secondary | ICD-10-CM | POA: Diagnosis not present

## 2016-04-08 DIAGNOSIS — J90 Pleural effusion, not elsewhere classified: Secondary | ICD-10-CM | POA: Diagnosis not present

## 2016-04-08 DIAGNOSIS — I132 Hypertensive heart and chronic kidney disease with heart failure and with stage 5 chronic kidney disease, or end stage renal disease: Secondary | ICD-10-CM | POA: Diagnosis not present

## 2016-04-08 DIAGNOSIS — N184 Chronic kidney disease, stage 4 (severe): Secondary | ICD-10-CM | POA: Diagnosis not present

## 2016-04-08 DIAGNOSIS — I472 Ventricular tachycardia: Secondary | ICD-10-CM | POA: Diagnosis not present

## 2016-04-08 DIAGNOSIS — Z87891 Personal history of nicotine dependence: Secondary | ICD-10-CM | POA: Insufficient documentation

## 2016-04-08 DIAGNOSIS — G4733 Obstructive sleep apnea (adult) (pediatric): Secondary | ICD-10-CM | POA: Insufficient documentation

## 2016-04-08 DIAGNOSIS — E1122 Type 2 diabetes mellitus with diabetic chronic kidney disease: Secondary | ICD-10-CM | POA: Insufficient documentation

## 2016-04-08 DIAGNOSIS — I5022 Chronic systolic (congestive) heart failure: Secondary | ICD-10-CM | POA: Insufficient documentation

## 2016-04-08 DIAGNOSIS — Z7982 Long term (current) use of aspirin: Secondary | ICD-10-CM | POA: Insufficient documentation

## 2016-04-08 DIAGNOSIS — I5043 Acute on chronic combined systolic (congestive) and diastolic (congestive) heart failure: Secondary | ICD-10-CM | POA: Diagnosis not present

## 2016-04-08 DIAGNOSIS — R0602 Shortness of breath: Secondary | ICD-10-CM | POA: Diagnosis not present

## 2016-04-08 DIAGNOSIS — Z79899 Other long term (current) drug therapy: Secondary | ICD-10-CM | POA: Insufficient documentation

## 2016-04-08 DIAGNOSIS — I110 Hypertensive heart disease with heart failure: Secondary | ICD-10-CM | POA: Diagnosis not present

## 2016-04-08 DIAGNOSIS — I495 Sick sinus syndrome: Secondary | ICD-10-CM | POA: Diagnosis not present

## 2016-04-08 DIAGNOSIS — N189 Chronic kidney disease, unspecified: Secondary | ICD-10-CM

## 2016-04-08 DIAGNOSIS — I130 Hypertensive heart and chronic kidney disease with heart failure and stage 1 through stage 4 chronic kidney disease, or unspecified chronic kidney disease: Secondary | ICD-10-CM | POA: Insufficient documentation

## 2016-04-08 DIAGNOSIS — I10 Essential (primary) hypertension: Secondary | ICD-10-CM

## 2016-04-08 DIAGNOSIS — I272 Pulmonary hypertension, unspecified: Secondary | ICD-10-CM | POA: Diagnosis not present

## 2016-04-08 DIAGNOSIS — Z720 Tobacco use: Secondary | ICD-10-CM

## 2016-04-08 DIAGNOSIS — J189 Pneumonia, unspecified organism: Secondary | ICD-10-CM | POA: Diagnosis not present

## 2016-04-08 NOTE — Patient Instructions (Signed)
Continue weighing daily and call for an overnight weight gain of > 2 pounds or a weekly weight gain of >5 pounds. 

## 2016-04-08 NOTE — Progress Notes (Signed)
Patient ID: Colton Thompson, male    DOB: [birth year], 71 y.o.   MRN: [id number]  HPI  Colton Thompson is a 71 y/o male with a history of obstructive sleep apnea, HTN, DM, CKD, recent tobacco use and chronic heart failure.   Last echo was done 03/28/16 and showed an EF of 20-25% along with mild/mod Colton/TR. Systolic PA pressure was severely elevated at 85 mm Hg. Had a cardiac catheterization done 04/03/16 and showed moderate three-vessel CAD. Recommendation was for optimizing medical therapy.  Admitted 03/28/16 with acute on chronic heart failure. Initially treated with IV diuretics which were then stopped due to acute renal failure. Nephrology and cardiology consults were done. Lifevest was applied prior to discharge. Discharged home after one week.   He presents today for his initial visit with fatigue and shortness of breath upon moderate exertion. Does have some swelling in his lower legs which he doesn't feel like is any worse. Is already weighing daily and says that his weight has been stable. Does have home health nurse and physical therapist coming.   Past Medical History:  Diagnosis Date  . CHF (congestive heart failure) (Colton Thompson)   . Chronic kidney disease   . DM (diabetes mellitus), type 2 (Colton Thompson)   . HTN (hypertension)   . Obstructive sleep apnea    Past Surgical History:  Procedure Laterality Date  . ABDOMINAL HERNIA REPAIR    . CARDIAC CATHETERIZATION    . RIGHT/LEFT HEART CATH AND CORONARY ANGIOGRAPHY N/A 04/03/2016   Procedure: Right/Left Heart Cath and Coronary Angiography;  Surgeon: Wellington Hampshire, MD;  Location: [facility];  Service: Cardiovascular;  Laterality: N/A;   Family History  Problem Relation Age of Onset  . Stroke Mother   . Stroke Father   . Hypertension Father   . Cancer Brother     Bone (Jaw)   Social History  Substance Use Topics  . Smoking status: Former Smoker    Packs/day: 0.50    Years: 50.00    Types: Cigarettes    Start date: 02/17/1968   Quit date: 03/25/2016  . Smokeless tobacco: Never Used  . Alcohol use No   No Known Allergies   Prior to Admission medications   Medication Sig Start Date End Date Taking? Authorizing Provider  albuterol (PROVENTIL HFA;VENTOLIN HFA) 108 (90 Base) MCG/ACT inhaler Inhale 1-2 puffs into the lungs every 6 (six) hours as needed for wheezing or shortness of breath.   Yes Historical Provider, MD  amiodarone (PACERONE) 200 MG tablet Take 1 tablet (200 mg total) by mouth 2 (two) times daily. 04/04/16  Yes Vaughan Basta, MD  aspirin EC 81 MG EC tablet Take 1 tablet (81 mg total) by mouth daily. 04/05/16  Yes Vaughan Basta, MD  carvedilol (COREG) 6.25 MG tablet Take 1 tablet (6.25 mg total) by mouth 2 (two) times daily with a meal. 04/04/16  Yes Vaughan Basta, MD  cloNIDine (CATAPRES) 0.1 MG tablet Take 1 tablet (0.1 mg total) by mouth 2 (two) times daily. 04/04/16  Yes Vaughan Basta, MD  furosemide (LASIX) 20 MG tablet Take 20 mg by mouth daily.   Yes Historical Provider, MD  hydrALAZINE (APRESOLINE) 100 MG tablet Take 100 mg by mouth 3 (three) times daily.   Yes Historical Provider, MD  insulin glargine (LANTUS) 100 UNIT/ML injection Inject 15 Units into the skin at bedtime.   Yes Historical Provider, MD  isosorbide mononitrate (IMDUR) 60 MG 24 hr tablet Take 1 tablet (60 mg total) by  mouth 2 (two) times daily. 04/04/16  Yes Vaughan Basta, MD  omeprazole (PRILOSEC) 20 MG capsule Take 20 mg by mouth 2 (two) times daily before a meal.   Yes Historical Provider, MD  pravastatin (PRAVACHOL) 40 MG tablet Take 40 mg by mouth daily.   Yes Historical Provider, MD  tamsulosin (FLOMAX) 0.4 MG CAPS capsule Take 1 capsule (0.4 mg total) by mouth daily. 04/05/16  Yes Vaughan Basta, MD  vitamin B-12 (CYANOCOBALAMIN) 1000 MCG tablet Take 1,000 mcg by mouth daily.   Yes Historical Provider, MD     Review of Systems  Constitutional: Positive for fatigue. Negative for appetite  change.  HENT: Positive for congestion. Negative for postnasal drip and sore throat.   Eyes: Negative.   Respiratory: Positive for shortness of breath. Negative for cough and chest tightness.   Cardiovascular: Positive for leg swelling. Negative for chest pain and palpitations.  Gastrointestinal: Negative for abdominal distention and abdominal pain.  Endocrine: Negative.   Genitourinary: Negative.   Musculoskeletal: Negative for back pain and neck pain.  Skin: Negative.   Allergic/Immunologic: Negative.   Neurological: Negative for dizziness and light-headedness.  Hematological: Negative for adenopathy. Does not bruise/bleed easily.  Psychiatric/Behavioral: Positive for sleep disturbance (sleeping on 2 pillows; wearing CPAP at night). Negative for dysphoric mood and suicidal ideas. The patient is not nervous/anxious.    Vitals:   04/08/16 0856  BP: 131/62  Pulse: (!) 53  Resp: 18  SpO2: 99%  Weight: 171 lb 4 oz (77.7 kg)  Height: 6' (1.829 m)    Physical Exam  Constitutional: He is oriented to person, place, and time. He appears well-developed and well-nourished.  HENT:  Head: Normocephalic and atraumatic.  Eyes: Conjunctivae are normal. Pupils are equal, round, and reactive to light.  Neck: Normal range of motion. Neck supple. No JVD present.  Cardiovascular: Regular rhythm.  Bradycardia present.   Pulmonary/Chest: Effort normal. He has no wheezes. He has no rales.  Abdominal: Soft. He exhibits no distension. There is no tenderness.  Musculoskeletal: He exhibits edema (1+ pitting edema in bilateral lower legs with the L>R). He exhibits no tenderness.  Neurological: He is alert and oriented to person, place, and time.  Skin: Skin is warm and dry.  Psychiatric: He has a normal mood and affect. His behavior is normal. Thought content normal.  Nursing note and vitals reviewed.  Assessment & Plan:  1: Chronic heart failure with reduced ejection fraction- - NYHA class III -  mildly fluid overloaded today; could take an extra furosemide if weight goes up or swelling gets worse - continue weighing daily and call for an overnight weight gain of >2 pounds or a weekly weight gain of >5 pounds - not adding salt. Discussed the importance of closely following a 2000mg  sodium diet and written information was given to him about this.  - GFR on 04/04/16 was 21 with a creatinine of 3.18 so will not start entresto at this time. Could consider doing in the future - sees cardiologist Rockey Situ) 04/16/16 - PharmD went in and reviewed medications with the patient  2: HTN- - BP looks good today  3: Diabetes-  - glucose was 141 after eating today - follows with PCP at the [place] regarding this  4: Obstructive sleep apnea- - wearing CPAP nightly  5: Tobacco use- - recently quit smoking 03/25/16 - complete cessation discussed for 3 minutes with him  Return in 1 month or sooner for any questions/problems before then.

## 2016-04-09 DIAGNOSIS — I472 Ventricular tachycardia: Secondary | ICD-10-CM | POA: Diagnosis not present

## 2016-04-09 DIAGNOSIS — I5043 Acute on chronic combined systolic (congestive) and diastolic (congestive) heart failure: Secondary | ICD-10-CM | POA: Diagnosis not present

## 2016-04-09 DIAGNOSIS — I130 Hypertensive heart and chronic kidney disease with heart failure and stage 1 through stage 4 chronic kidney disease, or unspecified chronic kidney disease: Secondary | ICD-10-CM | POA: Diagnosis not present

## 2016-04-09 DIAGNOSIS — E1122 Type 2 diabetes mellitus with diabetic chronic kidney disease: Secondary | ICD-10-CM | POA: Diagnosis not present

## 2016-04-09 DIAGNOSIS — I214 Non-ST elevation (NSTEMI) myocardial infarction: Secondary | ICD-10-CM | POA: Diagnosis not present

## 2016-04-09 DIAGNOSIS — N184 Chronic kidney disease, stage 4 (severe): Secondary | ICD-10-CM | POA: Diagnosis not present

## 2016-04-10 ENCOUNTER — Emergency Department: Payer: Medicare Other

## 2016-04-10 ENCOUNTER — Encounter: Payer: Self-pay | Admitting: Family

## 2016-04-10 ENCOUNTER — Encounter: Payer: Self-pay | Admitting: Nurse Practitioner

## 2016-04-10 ENCOUNTER — Inpatient Hospital Stay
Admission: EM | Admit: 2016-04-10 | Discharge: 2016-04-14 | DRG: 291 | Disposition: A | Discharge status: Home Health | Payer: Medicare Other | Attending: Specialist | Admitting: Specialist

## 2016-04-10 DIAGNOSIS — Z87891 Personal history of nicotine dependence: Secondary | ICD-10-CM | POA: Diagnosis not present

## 2016-04-10 DIAGNOSIS — J90 Pleural effusion, not elsewhere classified: Secondary | ICD-10-CM | POA: Diagnosis not present

## 2016-04-10 DIAGNOSIS — Z794 Long term (current) use of insulin: Secondary | ICD-10-CM

## 2016-04-10 DIAGNOSIS — I272 Pulmonary hypertension, unspecified: Secondary | ICD-10-CM | POA: Diagnosis present

## 2016-04-10 DIAGNOSIS — J440 Chronic obstructive pulmonary disease with acute lower respiratory infection: Secondary | ICD-10-CM | POA: Diagnosis present

## 2016-04-10 DIAGNOSIS — I255 Ischemic cardiomyopathy: Secondary | ICD-10-CM

## 2016-04-10 DIAGNOSIS — I509 Heart failure, unspecified: Secondary | ICD-10-CM | POA: Diagnosis not present

## 2016-04-10 DIAGNOSIS — I2510 Atherosclerotic heart disease of native coronary artery without angina pectoris: Secondary | ICD-10-CM | POA: Diagnosis present

## 2016-04-10 DIAGNOSIS — K219 Gastro-esophageal reflux disease without esophagitis: Secondary | ICD-10-CM | POA: Diagnosis present

## 2016-04-10 DIAGNOSIS — N186 End stage renal disease: Secondary | ICD-10-CM

## 2016-04-10 DIAGNOSIS — G4730 Sleep apnea, unspecified: Secondary | ICD-10-CM | POA: Insufficient documentation

## 2016-04-10 DIAGNOSIS — I248 Other forms of acute ischemic heart disease: Secondary | ICD-10-CM | POA: Diagnosis present

## 2016-04-10 DIAGNOSIS — J189 Pneumonia, unspecified organism: Secondary | ICD-10-CM

## 2016-04-10 DIAGNOSIS — N400 Enlarged prostate without lower urinary tract symptoms: Secondary | ICD-10-CM | POA: Diagnosis present

## 2016-04-10 DIAGNOSIS — I428 Other cardiomyopathies: Secondary | ICD-10-CM | POA: Diagnosis present

## 2016-04-10 DIAGNOSIS — I472 Ventricular tachycardia: Secondary | ICD-10-CM

## 2016-04-10 DIAGNOSIS — I160 Hypertensive urgency: Secondary | ICD-10-CM | POA: Diagnosis present

## 2016-04-10 DIAGNOSIS — I5022 Chronic systolic (congestive) heart failure: Secondary | ICD-10-CM | POA: Diagnosis not present

## 2016-04-10 DIAGNOSIS — N183 Chronic kidney disease, stage 3 (moderate): Secondary | ICD-10-CM | POA: Diagnosis not present

## 2016-04-10 DIAGNOSIS — I5020 Unspecified systolic (congestive) heart failure: Secondary | ICD-10-CM | POA: Diagnosis not present

## 2016-04-10 DIAGNOSIS — Z79899 Other long term (current) drug therapy: Secondary | ICD-10-CM | POA: Diagnosis not present

## 2016-04-10 DIAGNOSIS — I5043 Acute on chronic combined systolic (congestive) and diastolic (congestive) heart failure: Secondary | ICD-10-CM | POA: Diagnosis present

## 2016-04-10 DIAGNOSIS — E119 Type 2 diabetes mellitus without complications: Secondary | ICD-10-CM | POA: Insufficient documentation

## 2016-04-10 DIAGNOSIS — Z7982 Long term (current) use of aspirin: Secondary | ICD-10-CM | POA: Diagnosis not present

## 2016-04-10 DIAGNOSIS — I10 Essential (primary) hypertension: Secondary | ICD-10-CM | POA: Diagnosis not present

## 2016-04-10 DIAGNOSIS — Z823 Family history of stroke: Secondary | ICD-10-CM

## 2016-04-10 DIAGNOSIS — I5033 Acute on chronic diastolic (congestive) heart failure: Secondary | ICD-10-CM | POA: Diagnosis not present

## 2016-04-10 DIAGNOSIS — I5023 Acute on chronic systolic (congestive) heart failure: Secondary | ICD-10-CM | POA: Diagnosis not present

## 2016-04-10 DIAGNOSIS — I495 Sick sinus syndrome: Secondary | ICD-10-CM | POA: Diagnosis present

## 2016-04-10 DIAGNOSIS — I132 Hypertensive heart and chronic kidney disease with heart failure and with stage 5 chronic kidney disease, or end stage renal disease: Secondary | ICD-10-CM | POA: Diagnosis present

## 2016-04-10 DIAGNOSIS — Z8249 Family history of ischemic heart disease and other diseases of the circulatory system: Secondary | ICD-10-CM | POA: Diagnosis not present

## 2016-04-10 DIAGNOSIS — E1122 Type 2 diabetes mellitus with diabetic chronic kidney disease: Secondary | ICD-10-CM | POA: Diagnosis present

## 2016-04-10 DIAGNOSIS — Z8679 Personal history of other diseases of the circulatory system: Secondary | ICD-10-CM

## 2016-04-10 DIAGNOSIS — R748 Abnormal levels of other serum enzymes: Secondary | ICD-10-CM | POA: Diagnosis not present

## 2016-04-10 DIAGNOSIS — I081 Rheumatic disorders of both mitral and tricuspid valves: Secondary | ICD-10-CM | POA: Diagnosis present

## 2016-04-10 DIAGNOSIS — E785 Hyperlipidemia, unspecified: Secondary | ICD-10-CM | POA: Diagnosis present

## 2016-04-10 DIAGNOSIS — I110 Hypertensive heart disease with heart failure: Secondary | ICD-10-CM | POA: Diagnosis not present

## 2016-04-10 DIAGNOSIS — G4733 Obstructive sleep apnea (adult) (pediatric): Secondary | ICD-10-CM | POA: Diagnosis present

## 2016-04-10 DIAGNOSIS — R0602 Shortness of breath: Secondary | ICD-10-CM | POA: Diagnosis not present

## 2016-04-10 HISTORY — DX: Other cardiomyopathies: I42.8

## 2016-04-10 HISTORY — DX: Tobacco use: Z72.0

## 2016-04-10 HISTORY — DX: Atherosclerotic heart disease of native coronary artery without angina pectoris: I25.10

## 2016-04-10 HISTORY — DX: Chronic kidney disease, stage 4 (severe): N18.4

## 2016-04-10 HISTORY — DX: Chronic systolic (congestive) heart failure: I50.22

## 2016-04-10 LAB — CBC WITH DIFFERENTIAL/PLATELET
BASOS ABS: 0 10*3/uL (ref 0–0.1)
Basophils Relative: 1 %
Eosinophils Absolute: 0.3 10*3/uL (ref 0–0.7)
Eosinophils Relative: 4 %
HEMATOCRIT: 34.6 % — AB (ref 40.0–52.0)
HEMOGLOBIN: 12.1 g/dL — AB (ref 13.0–18.0)
LYMPHS PCT: 13 %
Lymphs Abs: 0.8 10*3/uL — ABNORMAL LOW (ref 1.0–3.6)
MCH: 30.8 pg (ref 26.0–34.0)
MCHC: 35 g/dL (ref 32.0–36.0)
MCV: 88.1 fL (ref 80.0–100.0)
MONO ABS: 0.5 10*3/uL (ref 0.2–1.0)
MONOS PCT: 8 %
NEUTROS ABS: 4.9 10*3/uL (ref 1.4–6.5)
Neutrophils Relative %: 74 %
Platelets: 236 10*3/uL (ref 150–440)
RBC: 3.92 MIL/uL — ABNORMAL LOW (ref 4.40–5.90)
RDW: 14.7 % — AB (ref 11.5–14.5)
WBC: 6.6 10*3/uL (ref 3.8–10.6)

## 2016-04-10 LAB — GLUCOSE, CAPILLARY
GLUCOSE-CAPILLARY: 226 mg/dL — AB (ref 65–99)
Glucose-Capillary: 196 mg/dL — ABNORMAL HIGH (ref 65–99)

## 2016-04-10 LAB — BASIC METABOLIC PANEL
Anion gap: 8 (ref 5–15)
BUN: 40 mg/dL — AB (ref 6–20)
CHLORIDE: 101 mmol/L (ref 101–111)
CO2: 26 mmol/L (ref 22–32)
Calcium: 8.5 mg/dL — ABNORMAL LOW (ref 8.9–10.3)
Creatinine, Ser: 3.13 mg/dL — ABNORMAL HIGH (ref 0.61–1.24)
GFR calc Af Amer: 21 mL/min — ABNORMAL LOW (ref >60)
GFR calc non Af Amer: 19 mL/min — ABNORMAL LOW (ref >60)
GLUCOSE: 178 mg/dL — AB (ref 65–99)
POTASSIUM: 4.8 mmol/L (ref 3.5–5.1)
Sodium: 135 mmol/L (ref 135–145)

## 2016-04-10 LAB — MRSA PCR SCREENING: MRSA by PCR: NEGATIVE

## 2016-04-10 LAB — BRAIN NATRIURETIC PEPTIDE: B Natriuretic Peptide: 909 pg/mL — ABNORMAL HIGH (ref 0.0–100.0)

## 2016-04-10 LAB — TROPONIN I: TROPONIN I: 0.05 ng/mL — AB (ref <0.03)

## 2016-04-10 LAB — PROCALCITONIN

## 2016-04-10 MED ORDER — ALBUTEROL SULFATE (2.5 MG/3ML) 0.083% IN NEBU
2.5000 mg | INHALATION_SOLUTION | Freq: Four times a day (QID) | RESPIRATORY_TRACT | Status: DC | PRN
  Administered 2016-04-11: 2.5 mg via RESPIRATORY_TRACT
  Filled 2016-04-10: qty 3

## 2016-04-10 MED ORDER — ASPIRIN EC 81 MG PO TBEC
81.0000 mg | DELAYED_RELEASE_TABLET | Freq: Every day | ORAL | Status: DC
  Administered 2016-04-11 – 2016-04-14 (×4): 81 mg via ORAL
  Filled 2016-04-10 (×5): qty 1

## 2016-04-10 MED ORDER — INSULIN ASPART 100 UNIT/ML ~~LOC~~ SOLN
0.0000 [IU] | Freq: Three times a day (TID) | SUBCUTANEOUS | Status: DC
  Administered 2016-04-10: 2 [IU] via SUBCUTANEOUS
  Administered 2016-04-11: 1 [IU] via SUBCUTANEOUS
  Administered 2016-04-11: 2 [IU] via SUBCUTANEOUS
  Administered 2016-04-13: 1 [IU] via SUBCUTANEOUS
  Administered 2016-04-14: 2 [IU] via SUBCUTANEOUS
  Administered 2016-04-14: 1 [IU] via SUBCUTANEOUS
  Filled 2016-04-10: qty 2
  Filled 2016-04-10 (×3): qty 1
  Filled 2016-04-10: qty 2

## 2016-04-10 MED ORDER — PIPERACILLIN-TAZOBACTAM 3.375 G IVPB
3.3750 g | Freq: Three times a day (TID) | INTRAVENOUS | Status: DC
  Administered 2016-04-10 – 2016-04-11 (×3): 3.375 g via INTRAVENOUS
  Filled 2016-04-10 (×3): qty 50

## 2016-04-10 MED ORDER — PRAVASTATIN SODIUM 40 MG PO TABS
40.0000 mg | ORAL_TABLET | Freq: Every day | ORAL | Status: DC
  Administered 2016-04-10 – 2016-04-13 (×4): 40 mg via ORAL
  Filled 2016-04-10 (×4): qty 1

## 2016-04-10 MED ORDER — CARVEDILOL 6.25 MG PO TABS
6.2500 mg | ORAL_TABLET | Freq: Two times a day (BID) | ORAL | Status: DC
  Administered 2016-04-10: 6.25 mg via ORAL
  Filled 2016-04-10: qty 1

## 2016-04-10 MED ORDER — ONDANSETRON HCL 4 MG PO TABS: 4.0000 mg | ORAL_TABLET | Freq: Four times a day (QID) | ORAL | Status: DC | PRN

## 2016-04-10 MED ORDER — AMIODARONE HCL 200 MG PO TABS
200.0000 mg | ORAL_TABLET | Freq: Two times a day (BID) | ORAL | Status: DC
  Administered 2016-04-10 – 2016-04-11 (×3): 200 mg via ORAL
  Filled 2016-04-10 (×3): qty 1

## 2016-04-10 MED ORDER — VITAMIN B-12 1000 MCG PO TABS
1000.0000 ug | ORAL_TABLET | Freq: Every day | ORAL | Status: DC
  Administered 2016-04-10 – 2016-04-14 (×5): 1000 ug via ORAL
  Filled 2016-04-10 (×5): qty 1

## 2016-04-10 MED ORDER — SODIUM CHLORIDE 0.9 % IV SOLN: 250.0000 mL | INTRAVENOUS | Status: DC | PRN

## 2016-04-10 MED ORDER — ISOSORBIDE MONONITRATE ER 60 MG PO TB24
60.0000 mg | ORAL_TABLET | Freq: Two times a day (BID) | ORAL | Status: DC
  Administered 2016-04-10: 60 mg via ORAL
  Filled 2016-04-10: qty 1

## 2016-04-10 MED ORDER — HYDRALAZINE HCL 50 MG PO TABS
100.0000 mg | ORAL_TABLET | Freq: Three times a day (TID) | ORAL | Status: DC
  Administered 2016-04-10 – 2016-04-13 (×9): 100 mg via ORAL
  Filled 2016-04-10 (×10): qty 2

## 2016-04-10 MED ORDER — ACETAMINOPHEN 650 MG RE SUPP: 650.0000 mg | Freq: Four times a day (QID) | RECTAL | Status: DC | PRN

## 2016-04-10 MED ORDER — FUROSEMIDE 10 MG/ML IJ SOLN
20.0000 mg | Freq: Two times a day (BID) | INTRAMUSCULAR | Status: DC
Start: 2016-04-10 — End: 2016-04-10
  Administered 2016-04-10: 20 mg via INTRAVENOUS
  Filled 2016-04-10: qty 2

## 2016-04-10 MED ORDER — SENNOSIDES-DOCUSATE SODIUM 8.6-50 MG PO TABS
1.0000 | ORAL_TABLET | Freq: Every evening | ORAL | Status: DC | PRN
  Administered 2016-04-10 – 2016-04-11 (×2): 1 via ORAL
  Filled 2016-04-10 (×2): qty 1

## 2016-04-10 MED ORDER — ENOXAPARIN SODIUM 30 MG/0.3ML ~~LOC~~ SOLN
30.0000 mg | SUBCUTANEOUS | Status: DC
  Administered 2016-04-10 – 2016-04-13 (×4): 30 mg via SUBCUTANEOUS
  Filled 2016-04-10 (×4): qty 0.3

## 2016-04-10 MED ORDER — ONDANSETRON HCL 4 MG/2ML IJ SOLN
4.0000 mg | Freq: Four times a day (QID) | INTRAMUSCULAR | Status: DC | PRN
  Administered 2016-04-12 – 2016-04-14 (×2): 4 mg via INTRAVENOUS
  Filled 2016-04-10 (×3): qty 2

## 2016-04-10 MED ORDER — INSULIN GLARGINE 100 UNIT/ML ~~LOC~~ SOLN
15.0000 [IU] | Freq: Every day | SUBCUTANEOUS | Status: DC
  Administered 2016-04-10 – 2016-04-11 (×2): 15 [IU] via SUBCUTANEOUS
  Filled 2016-04-10 (×5): qty 0.15

## 2016-04-10 MED ORDER — INSULIN ASPART 100 UNIT/ML ~~LOC~~ SOLN
0.0000 [IU] | Freq: Every day | SUBCUTANEOUS | Status: DC
  Administered 2016-04-10: 2 [IU] via SUBCUTANEOUS
  Filled 2016-04-10: qty 2

## 2016-04-10 MED ORDER — HYDRALAZINE HCL 20 MG/ML IJ SOLN
10.0000 mg | Freq: Four times a day (QID) | INTRAMUSCULAR | Status: DC | PRN
  Administered 2016-04-10 – 2016-04-13 (×5): 10 mg via INTRAVENOUS
  Filled 2016-04-10 (×5): qty 1

## 2016-04-10 MED ORDER — TRAMADOL HCL 50 MG PO TABS: 50.0000 mg | ORAL_TABLET | Freq: Four times a day (QID) | ORAL | Status: DC | PRN

## 2016-04-10 MED ORDER — SODIUM CHLORIDE 0.9% FLUSH
3.0000 mL | Freq: Two times a day (BID) | INTRAVENOUS | Status: DC
  Administered 2016-04-10 – 2016-04-11 (×3): 3 mL via INTRAVENOUS

## 2016-04-10 MED ORDER — PANTOPRAZOLE SODIUM 40 MG PO TBEC
40.0000 mg | DELAYED_RELEASE_TABLET | Freq: Every day | ORAL | Status: DC
  Administered 2016-04-10 – 2016-04-14 (×5): 40 mg via ORAL
  Filled 2016-04-10 (×5): qty 1

## 2016-04-10 MED ORDER — TAMSULOSIN HCL 0.4 MG PO CAPS
0.4000 mg | ORAL_CAPSULE | Freq: Every day | ORAL | Status: DC
  Administered 2016-04-10 – 2016-04-14 (×5): 0.4 mg via ORAL
  Filled 2016-04-10 (×5): qty 1

## 2016-04-10 MED ORDER — NITROGLYCERIN IN D5W 200-5 MCG/ML-% IV SOLN: 2.0000 ug/min | INTRAVENOUS | Status: DC

## 2016-04-10 MED ORDER — FUROSEMIDE 10 MG/ML IJ SOLN
40.0000 mg | Freq: Two times a day (BID) | INTRAMUSCULAR | Status: DC
  Administered 2016-04-10 – 2016-04-11 (×3): 40 mg via INTRAVENOUS
  Filled 2016-04-10 (×3): qty 4

## 2016-04-10 MED ORDER — LABETALOL HCL 5 MG/ML IV SOLN
10.0000 mg | Freq: Once | INTRAVENOUS | Status: AC
  Administered 2016-04-10: 10 mg via INTRAVENOUS
  Filled 2016-04-10: qty 4

## 2016-04-10 MED ORDER — ALBUTEROL SULFATE HFA 108 (90 BASE) MCG/ACT IN AERS: 1.0000 | INHALATION_SPRAY | Freq: Four times a day (QID) | RESPIRATORY_TRACT | Status: DC | PRN

## 2016-04-10 MED ORDER — FUROSEMIDE 10 MG/ML IJ SOLN
20.0000 mg | Freq: Once | INTRAMUSCULAR | Status: AC
  Administered 2016-04-10: 20 mg via INTRAVENOUS
  Filled 2016-04-10: qty 4

## 2016-04-10 MED ORDER — ACETAMINOPHEN 325 MG PO TABS: 650.0000 mg | ORAL_TABLET | Freq: Four times a day (QID) | ORAL | Status: DC | PRN

## 2016-04-10 MED ORDER — CLONIDINE HCL 0.1 MG PO TABS
0.2000 mg | ORAL_TABLET | Freq: Two times a day (BID) | ORAL | Status: DC
  Administered 2016-04-10 – 2016-04-11 (×3): 0.2 mg via ORAL
  Filled 2016-04-10 (×3): qty 2

## 2016-04-10 MED ORDER — SODIUM CHLORIDE 0.9% FLUSH
3.0000 mL | Freq: Two times a day (BID) | INTRAVENOUS | Status: DC
  Administered 2016-04-10 – 2016-04-14 (×8): 3 mL via INTRAVENOUS

## 2016-04-10 MED ORDER — FUROSEMIDE 10 MG/ML IJ SOLN: 40.0000 mg | Freq: Two times a day (BID) | INTRAMUSCULAR | Status: DC

## 2016-04-10 MED ORDER — ASPIRIN 81 MG PO CHEW
324.0000 mg | CHEWABLE_TABLET | Freq: Once | ORAL | Status: AC
  Administered 2016-04-10: 324 mg via ORAL
  Filled 2016-04-10: qty 4

## 2016-04-10 MED ORDER — NITROGLYCERIN 0.4 MG SL SUBL
0.4000 mg | SUBLINGUAL_TABLET | SUBLINGUAL | Status: AC
  Administered 2016-04-10 (×3): 0.4 mg via SUBLINGUAL
  Filled 2016-04-10 (×2): qty 1

## 2016-04-10 NOTE — Progress Notes (Signed)
Pt refused cpap, states he feels like he is smothering and it dries his mouth out. RN aware

## 2016-04-10 NOTE — ED Triage Notes (Signed)
Pt states that he started having difficulty breathing around midnight last night, pt states a little discomfort in his epigastric area. Pt is currently wearing a life vest

## 2016-04-10 NOTE — Evaluation (Signed)
Physical Therapy Evaluation Patient Details Name: Colton Thompson MRN: [id number] DOB: [birth year] Today's Date: 04/10/2016   History of Present Illness  Pt is a 71 yo male, admitted to [facility] increaed SOB and weight gain, diagnosed w/ CHF and x-ray positive for pneumonia. Was also admitted on 02/10 for SOB and was placed on Bipap, s/p cardiac cath via R radial Artery 04/03/16. PMH includes; chronic combined systolic and diastolic heart failure, CKD stage 4, HTN, type II DM, and sleep apnea    Clinical Impression  Pt awake alert and demonstrated good safety awareness throughout PT eval. Pt able to perform all mobility tasks independently under PT supervision and displayed improved stability w/ use of SPC during ambulation. He states he has [place] at home but does not use it, educated the patient on importance of using the cane to improve balance and energy conservation. Patient displays good mobility and strength but somewhat limited in activity tolerance due to current cardiopulmonary impairments. Pt will benefit from skilled PT to improve cardiopulmonary function for safe functional tasks. Pt states he has been receiving HHPT and recommend he continue HHPT following acute hospital stay.     Follow Up Recommendations Home health PT    Equipment Recommendations  None recommended by PT    Recommendations for Other Services       Precautions / Restrictions Precautions Precautions: None Restrictions Weight Bearing Restrictions: Yes RUE Weight Bearing: Non weight bearing Other Position/Activity Restrictions: NWBing R wrist due to cardiac cath on 04/03/16      Mobility  Bed Mobility Overal bed mobility: Independent                Transfers Overall transfer level: Independent                  Ambulation/Gait Ambulation/Gait assistance: Supervision Ambulation Distance (Feet): 200 Feet Assistive device: Straight cane Gait Pattern/deviations: WFL(Within Functional Limits)    Gait velocity interpretation: at or above normal speed for age/gender General Gait Details: ambulated w/ SPC and w/o AD, appeared more stable w/ decreased lat swaying when using SPC, O2 stayed above 91% on room air,   Stairs            Wheelchair Mobility    Modified Rankin (Stroke Patients Only)       Balance Overall balance assessment: Needs assistance Sitting-balance support: No upper extremity supported;Feet supported Sitting balance-Leahy Scale: Normal     Standing balance support: Single extremity supported;During functional activity Standing balance-Leahy Scale: Good Standing balance comment: improved stability w/ use of SPC in standing, good static balance                              Pertinent Vitals/Pain Pain Assessment: No/denies pain    Home Living Family/patient expects to be discharged to:: Private residence Living Arrangements: Spouse/significant other Available Help at Discharge: Available 24 hours/day;Family (spouse) Type of Home: House Home Access: Stairs to enter   Technical brewer of Steps: 3 Home Layout: One level Home Equipment: Cane - single point      Prior Function Level of Independence: Independent         Comments: Able to perform ADLs and IADLs, lately noticed increased dyspnea      Hand Dominance   Dominant Hand: Right    Extremity/Trunk Assessment   Upper Extremity Assessment Upper Extremity Assessment: Overall WFL for tasks assessed    Lower Extremity Assessment Lower Extremity Assessment: Overall WFL for  tasks assessed       Communication   Communication: No difficulties  Cognition Arousal/Alertness: Awake/alert Behavior During Therapy: WFL for tasks assessed/performed Overall Cognitive Status: Within Functional Limits for tasks assessed                      General Comments      Exercises     Assessment/Plan    PT Assessment Patient needs continued PT services  PT Problem  List Cardiopulmonary status limiting activity;Decreased activity tolerance;Decreased knowledge of use of DME;Decreased balance       PT Treatment Interventions DME instruction;Gait training;Stair training;Functional mobility training;Balance training;Therapeutic exercise;Therapeutic activities;Patient/family education    PT Goals (Current goals can be found in the Care Plan section)  Acute Rehab PT Goals Patient Stated Goal: Return home PT Goal Formulation: With patient/family Time For Goal Achievement: 04/24/16 Potential to Achieve Goals: Good    Frequency Min 2X/week   Barriers to discharge        Co-evaluation               End of Session Equipment Utilized During Treatment: Gait belt Activity Tolerance: Patient tolerated treatment well Patient left: in bed;with family/visitor present Nurse Communication: Mobility status PT Visit Diagnosis: Other abnormalities of gait and mobility (R26.89)         Time: 4462-8638 PT Time Calculation (min) (ACUTE ONLY): 13 min   Charges:   PT Evaluation $PT Eval Low Complexity: 1 Procedure     PT G Codes:         Barb Shear Student PT 04/10/2016, 4:10 PM

## 2016-04-10 NOTE — Progress Notes (Signed)
Inpatient Diabetes Program Recommendations  AACE/ADA: New Consensus Statement on Inpatient Glycemic Control (2015)  Target Ranges:  Prepandial:   less than 140 mg/dL      Peak postprandial:   less than 180 mg/dL (1-2 hours)      Critically ill patients:  140 - 180 mg/dL   Lab Results  Component Value Date   GLUCAP 133 (H) 04/04/2016   HGBA1C 5.9 (H) 03/28/2016    Last visit in early February, patient was getting Lantus 12 units but recently taking Lantus 15 units qhs at home.   Diabetes history:Type 2 Outpatient Diabetes medications: Lantus 15 units qhs  Current orders for Inpatient glycemic control: Novolog 0-15 units tid, Novolog 0-5 units qhs, Lantus 15 units qhs  Inpatient Diabetes Program Recommendations: Consider decreasing Lantus to 12 units qhs beginning tonight  Gentry Fitz, RN, [place], [place], CDE Diabetes Coordinator Inpatient Diabetes Program  [phone] (Team Pager) [phone] ([place]) 04/10/2016 2:42 PM

## 2016-04-10 NOTE — Progress Notes (Signed)
Patient requested an Advance Directives to patient. Patient said he was going to read it and fill it out when ready. Chaplain provided education and the document to patient.

## 2016-04-10 NOTE — Care Management Note (Signed)
Case Management Note  Patient Details  Name: IGNACIO LOWDER MRN: [id number] Date of Birth: 03/31/1945  Subjective/Objective:   Pt does have VA benefits, and is signing the [place] packet. Once completed it will be sent to the [place].                 Action/Plan:   Expected Discharge Date:                  Expected Discharge Plan:     In-House Referral:     Discharge planning Services     Post Acute Care Choice:    Choice offered to:     DME Arranged:    DME Agency:     HH Arranged:    HH Agency:     Status of Service:     If discussed at [employer] of Stay Meetings, dates discussed:    Additional Comments:  Beau Fanny, RN 04/10/2016, 9:45 AM

## 2016-04-10 NOTE — ED Notes (Signed)
Patient transported to X-ray 

## 2016-04-10 NOTE — ED Provider Notes (Addendum)
[facility] Emergency Department Provider Note  ____________________________________________  Time seen: Approximately 8:53 AM  I have reviewed the triage vital signs and the nursing notes.   HISTORY  Chief Complaint Shortness of Breath    HPI Colton Thompson is a 71 y.o. male who complains of gradual onset worsening shortness of breath over the last 24 hours. He was recently hospitalized for congestive heart failure and pulmonary edema requiring BiPAP. He reports that since leaving the hospital, mostly weak his weight has been a baseline of 167 pounds but recently has been increased by 2 pounds. Today it's about 175 pounds.  He reports orthopnea and dyspnea on exertion since last night. Has shortness of breath but feels relatively comfortable with sitting still. As any pain. No fevers chills or cough. Former smoker, quit 1 week ago.     Past Medical History:  Diagnosis Date  . CHF (congestive heart failure) ([place])   . Chronic kidney disease   . DM (diabetes mellitus), type 2 ([place])   . HTN (hypertension)      Patient Active Problem List   Diagnosis Date Noted  . ARF (acute renal failure) ([place])   . VT (ventricular tachycardia) ([place])   . Chronic renal failure, stage 4 (severe) ([facility])   . Ischemic cardiomyopathy   . Acute on chronic combined systolic and diastolic CHF (congestive heart failure) ([facility]) 03/28/2016  . Acute renal failure superimposed on stage 3 chronic kidney disease ([place])   . Dilated cardiomyopathy ([place])   . Hypertensive urgency   . Centrilobular emphysema ([place])   . Smoker   . Pulmonary hypertension      Past Surgical History:  Procedure Laterality Date  . ABDOMINAL HERNIA REPAIR    . CARDIAC CATHETERIZATION    . RIGHT/LEFT HEART CATH AND CORONARY ANGIOGRAPHY N/A 04/03/2016   Procedure: Right/Left Heart Cath and Coronary Angiography;  Surgeon: Wellington Hampshire, MD;  Location: [facility];  Service: Cardiovascular;   Laterality: N/A;     Prior to Admission medications   Medication Sig Start Date End Date Taking? Authorizing Provider  amiodarone (PACERONE) 200 MG tablet Take 1 tablet (200 mg total) by mouth 2 (two) times daily. 04/04/16  Yes Vaughan Basta, MD  aspirin EC 81 MG EC tablet Take 1 tablet (81 mg total) by mouth daily. 04/05/16  Yes Vaughan Basta, MD  carvedilol (COREG) 6.25 MG tablet Take 1 tablet (6.25 mg total) by mouth 2 (two) times daily with a meal. 04/04/16  Yes Vaughan Basta, MD  cloNIDine (CATAPRES) 0.1 MG tablet Take 1 tablet (0.1 mg total) by mouth 2 (two) times daily. 04/04/16  Yes Vaughan Basta, MD  furosemide (LASIX) 20 MG tablet Take 20 mg by mouth daily.   Yes Historical Provider, MD  hydrALAZINE (APRESOLINE) 100 MG tablet Take 100 mg by mouth 3 (three) times daily.   Yes Historical Provider, MD  insulin glargine (LANTUS) 100 UNIT/ML injection Inject 15 Units into the skin at bedtime.   Yes Historical Provider, MD  isosorbide mononitrate (IMDUR) 60 MG 24 hr tablet Take 1 tablet (60 mg total) by mouth 2 (two) times daily. 04/04/16  Yes Vaughan Basta, MD  omeprazole (PRILOSEC) 20 MG capsule Take 20 mg by mouth 2 (two) times daily before a meal.   Yes Historical Provider, MD  pravastatin (PRAVACHOL) 40 MG tablet Take 40 mg by mouth daily.   Yes Historical Provider, MD  tamsulosin (FLOMAX) 0.4 MG CAPS capsule Take 1 capsule (0.4 mg total) by  mouth daily. 04/05/16  Yes Vaughan Basta, MD  vitamin B-12 (CYANOCOBALAMIN) 1000 MCG tablet Take 1,000 mcg by mouth daily.   Yes Historical Provider, MD  albuterol (PROVENTIL HFA;VENTOLIN HFA) 108 (90 Base) MCG/ACT inhaler Inhale 1-2 puffs into the lungs every 6 (six) hours as needed for wheezing or shortness of breath.    Historical Provider, MD     Allergies Patient has no known allergies.   Family History  Problem Relation Age of Onset  . Stroke Mother   . Stroke Father   . Hypertension Father    . Cancer Brother     Bone (Jaw)    Social History Social History  Substance Use Topics  . Smoking status: Former Smoker    Packs/day: 0.50    Years: 50.00    Types: Cigarettes    Start date: 02/17/1968    Quit date: 03/25/2016  . Smokeless tobacco: Never Used  . Alcohol use No    Review of Systems  Constitutional:   No fever or chills.  ENT:   No sore throat. No rhinorrhea. Cardiovascular:   No chest pain. Respiratory:   Positive shortness of breath. Gastrointestinal:   Negative for abdominal pain, vomiting and diarrhea.  Genitourinary:   Negative for dysuria or difficulty urinating. Musculoskeletal:   Negative for focal pain or swelling Neurological:   Negative for headaches 10-point ROS otherwise negative.  ____________________________________________   PHYSICAL EXAM:  VITAL SIGNS: ED Triage Vitals  Enc Vitals Group     BP 04/10/16 0806 (!) 220/75     Pulse Rate 04/10/16 0806 61     Resp 04/10/16 0806 (!) 22     Temp 04/10/16 0806 97.9 F (36.6 C)     Temp Source 04/10/16 0806 Oral     SpO2 04/10/16 0806 94 %     Weight 04/10/16 0807 167 lb (75.8 kg)     Height 04/10/16 0807 6' (1.829 m)     Head Circumference --      Peak Flow --      Pain Score 04/10/16 0807 5     Pain Loc --      Pain Edu? --      Excl. in [place]? --     Vital signs reviewed, nursing assessments reviewed.   Constitutional:   Alert and oriented. Not in distress. Eyes:   No scleral icterus. No conjunctival pallor. PERRL. EOMI.  No nystagmus. ENT   Head:   Normocephalic and atraumatic.   Nose:   No congestion/rhinnorhea. No septal hematoma   Mouth/Throat:   MMM, no pharyngeal erythema. No peritonsillar mass.    Neck:   No stridor. No SubQ emphysema. No meningismus. Positive JVD Hematological/Lymphatic/Immunilogical:   No cervical lymphadenopathy. Cardiovascular:   RRR. Symmetric bilateral radial and DP pulses.  No murmurs.  Respiratory:   Normal respiratory effort without  tachypnea nor retractions. Breath sounds are clear and equal bilaterally. No wheezes/rales/rhonchi. Gastrointestinal:   Soft and nontender. Non distended. There is no CVA tenderness.  No rebound, rigidity, or guarding. Genitourinary:   deferred Musculoskeletal:   Normal range of motion in all extremities. No joint effusions.  No lower extremity tenderness.  No edema. Neurologic:   Normal speech and language.  CN 2-10 normal. Motor grossly intact. No gross focal neurologic deficits are appreciated.  Skin:    Skin is warm, dry and intact. No rash noted.  No petechiae, purpura, or bullae.  ____________________________________________    LABS (pertinent positives/negatives) (all labs ordered are listed, but only  abnormal results are displayed) Labs Reviewed  BASIC METABOLIC PANEL - Abnormal; Notable for the following:       Result Value   Glucose, Bld 178 (*)    BUN 40 (*)    Creatinine, Ser 3.13 (*)    Calcium 8.5 (*)    GFR calc non Af Amer 19 (*)    GFR calc Af Amer 21 (*)    All other components within normal limits  TROPONIN I - Abnormal; Notable for the following:    Troponin I 0.05 (*)    All other components within normal limits  CBC WITH DIFFERENTIAL/PLATELET - Abnormal; Notable for the following:    RBC 3.92 (*)    Hemoglobin 12.1 (*)    HCT 34.6 (*)    RDW 14.7 (*)    Lymphs Abs 0.8 (*)    All other components within normal limits  BRAIN NATRIURETIC PEPTIDE - Abnormal; Notable for the following:    B Natriuretic Peptide 909.0 (*)    All other components within normal limits   ____________________________________________   EKG  Interpreted by me Normal sinus rhythm rate of 63, leftward axis, normal intervals. Poor R-wave progression in anterior precordial leads. Normal ST segments. Slight T-wave inversions V4 to V6. Unchanged from 04/01/2016.  ____________________________________________    RADIOLOGY  No results found. Chest x-ray reviewed by me. No overt  pulmonary edema. No pneumothorax ____________________________________________   PROCEDURES Procedures  ____________________________________________   INITIAL IMPRESSION / ASSESSMENT AND PLAN / ED COURSE  Pertinent labs & imaging results that were available during my care of the patient were reviewed by me and considered in my medical decision making (see chart for details).  Patient presents with shortness of breath, symptoms of CHF. Presentation currently not consistent with PE pneumothorax pneumonia sepsis COPD or bronchospasm ACS or carditis. Check chest x-ray and labs. Nitroglycerin sublingual, IV Lasix.   ----------------------------------------- 9:40 AM on 04/10/2016 ----------------------------------------- Remain severely hypertensive about 200/80 despite nitroglycerin and Lasix. Chest x-ray essentially unremarkable. Troponin 0.05. Record show the patient had an NSTEMI during his last hospitalization for similar symptoms. He has advanced heart failure. Case discussed with hospitalist for further monitoring and management due to relatively high risk for decompensation.  ----------------------------------------- 10:01 AM on 04/10/2016 -----------------------------------------  Patient actually a [facility] patient. Agrees to be transferred to the [place]. Have submitted the [facility] transfer packet and will await a response. Patient reports symptoms are unchanged so far.    ----------------------------------------- 12:03 PM on 04/10/2016 -----------------------------------------  [facility] notifies [place] their full and unable to accept patient. They authorized hospitalization in our facility.       ____________________________________________   FINAL CLINICAL IMPRESSION(S) / ED DIAGNOSES  Final diagnoses:  Acute on chronic combined systolic and diastolic congestive heart failure ([place])  Severe hypertension      New Prescriptions   No medications on file     Portions of this note  were generated with dragon dictation software. Dictation errors may occur despite best attempts at proofreading.    Carrie Mew, MD 04/10/16 [place], MD 04/10/16 [phone]

## 2016-04-10 NOTE — Consult Note (Signed)
Cardiology Consult    Patient ID: Colton Thompson MRN: [id number], DOB/AGE: 03/20/1945   Admit date: 04/10/2016 Date of Consult: 04/10/2016  Primary Physician: Marsh Dolly, MD Primary Cardiologist: Johnny Bridge, MD  Requesting Provider: Gardiner Coins, MD  Patient Profile    71 year old male with a prior history of severe hypertension, systolic heart failure, nonobstructive CAD, mixed ischemic and nonischemic cardiomyopathy myopathy, stage IV chronic kidney disease, sleep apnea, and tobacco abuse, who was recently discharged following heart failure admission, and presented to the ED February 23rd with recurrent dyspnea and weight gain.  Past Medical History   Past Medical History:  Diagnosis Date  . Chronic systolic CHF (congestive heart failure) ([place])    a. 03/2016 Echo: EF 20-25%, diff HK, mild to mod MR, mildly dil LA, mild to mod TR, PASP 74mmHg.  . CKD (chronic kidney disease), stage IV ([place])   . DM (diabetes mellitus), type 2 ([place])   . HTN (hypertension)   . NICM (nonischemic cardiomyopathy) ([place])    a. 03/2016 Echo: Ef 20-25%.  . Non-obstructive CAD (coronary artery disease)    a. 03/2016 Cath: LM nl, LAD 60p, 50m, 60d, D1/2 min irregs, D3 nl, LCX 70ost/p, OM1/2/3 nl, RCA 60p, 11m/d, RPDA min irregs.  . Obstructive sleep apnea   . Tobacco abuse     Past Surgical History:  Procedure Laterality Date  . ABDOMINAL HERNIA REPAIR    . CARDIAC CATHETERIZATION    . RIGHT/LEFT HEART CATH AND CORONARY ANGIOGRAPHY N/A 04/03/2016   Procedure: Right/Left Heart Cath and Coronary Angiography;  Surgeon: Wellington Hampshire, MD;  Location: [facility];  Service: Cardiovascular;  Laterality: N/A;     Allergies  No Known Allergies  History of Present Illness    71 year old male with the above complex past medical history. He is been followed at the [facility] hospital. He has a long history of tobacco abuse, hypertension, benign prostatic hypertrophy, and reported CHF. He was recently admitted for  volume overload and troponin elevation. He was markedly hypertensive at the time. He underwent echo which showed an EF of 20-25% with diffuse hypokinesis and mild to moderate mitral and tricuspid valve disease. Following diuresis, he did have some worsening of renal function. He was able to undergo catheterization with minimal contrast revealing moderate nonobstructive CAD. Medical therapy was recommended. Filling pressures were only mildly elevated by the time he underwent catheterization and he was placed on oral Lasix. During hospital is a sheet, he did have nonsustained ventricular tachycardia, and in that setting, a life vest was placed. He was subsequently discharged on February 17 on lasix 20 daily (was on lasix 40 bid prior to d/c).  He was seen in CHF clinic on 2/21. Wt was listed as up 5 lbs but he was fully clothed and weighing lifevest. Wife notes that wt @ home @ that time was stable @ ~ 166-167.  He was advised to take an additional lasix prn.  His wife cont to follow his wt diligently and was also preparing all of his meals.  They have not been using any salt.  He has been compliant with meds.  Beginning 2/22, he began to note DOE with mild edema and increasing abd girth.  Wt apparently stable.  BP was in the 170's when checked by HHPT yesterday.  He was dyspneic all night last night w/ significant orthopnea.  Due to ongoing dyspnea this am, he presented to the ED.  There, BP was markedly elevated.  CXR with evidence  of edema, ? Of pna.  WBC nl.  Afebrile.  Admitted for further eval. Currently stable.  Minus 650 ml so far.  No chest pain.  Inpatient Medications    . amiodarone  200 mg Oral BID  . aspirin EC  81 mg Oral Daily  . carvedilol  6.25 mg Oral BID WC  . cloNIDine  0.2 mg Oral BID  . enoxaparin (LOVENOX) injection  30 mg Subcutaneous Q24H  . furosemide  20 mg Intravenous BID  . hydrALAZINE  100 mg Oral TID  . insulin aspart  0-5 Units Subcutaneous QHS  . insulin aspart  0-9 Units  Subcutaneous TID WC  . insulin glargine  15 Units Subcutaneous QHS  . isosorbide mononitrate  60 mg Oral BID  . pantoprazole  40 mg Oral Daily  . piperacillin-tazobactam (ZOSYN)  IV  3.375 g Intravenous Q8H  . pravastatin  40 mg Oral q1800  . sodium chloride flush  3 mL Intravenous Q12H  . sodium chloride flush  3 mL Intravenous Q12H  . tamsulosin  0.4 mg Oral Daily  . vitamin B-12  1,000 mcg Oral Daily    Family History    Family History  Problem Relation Age of Onset  . Stroke Mother   . Stroke Father   . Hypertension Father   . Cancer Brother     Bone (Jaw)    Social History    Social History   Social History  . Marital status: Married    Spouse name: N/A  . Number of children: N/A  . Years of education: N/A   Occupational History  . Not on file.   Social History Main Topics  . Smoking status: Former Smoker    Packs/day: 0.50    Years: 50.00    Types: Cigarettes    Start date: 02/17/1968    Quit date: 03/25/2016  . Smokeless tobacco: Never Used  . Alcohol use No  . Drug use: No  . Sexual activity: Not on file   Other Topics Concern  . Not on file   Social History Narrative  . No narrative on file     Review of Systems    General:  No chills, fever, night sweats or weight changes.  Cardiovascular:  No chest pain, +++ dyspnea on exertion, +++ mild LE edema and inc abd girth, +++ orthopnea, no palpitations, paroxysmal nocturnal dyspnea. Dermatological: No rash, lesions/masses Respiratory: No cough, +++ dyspnea Urologic: No hematuria, dysuria Abdominal:   No nausea, vomiting, diarrhea, bright red blood per rectum, melena, or hematemesis Neurologic:  No visual changes, wkns, changes in mental status. All other systems reviewed and are otherwise negative except as noted above.  Physical Exam    Blood pressure (!) 190/80, pulse (!) 56, temperature 97.9 F (36.6 C), temperature source Oral, resp. rate (!) 23, height 6' (1.829 m), weight 169 lb 6.4 oz (76.8  kg), SpO2 94 %.  General: Pleasant, NAD Psych: Normal affect. Neuro: Alert and oriented X 3. Moves all extremities spontaneously. HEENT: Normal  Neck: Supple without bruits.  JVD to jaw. Lungs:  Resp regular and unlabored, diminished breath sounds, scattered rhonchi throughout. Heart: RRR, distant, no s3, s4, or murmurs. Abdomen: Semi-firm, non-tender, non-distended, BS + x 4.  Extremities: No clubbing, cyanosis or edema. DP/PT/Radials 2+ and equal bilaterally.  Labs     Recent Labs  04/10/16 0828  TROPONINI 0.05*   Lab Results  Component Value Date   WBC 6.6 04/10/2016   HGB 12.1 (L) 04/10/2016  HCT 34.6 (L) 04/10/2016   MCV 88.1 04/10/2016   PLT 236 04/10/2016    Recent Labs Lab 04/10/16 0828  NA 135  K 4.8  CL 101  CO2 26  BUN 40*  CREATININE 3.13*  CALCIUM 8.5*  GLUCOSE 178*   Lab Results  Component Value Date   CHOL 128 04/04/2016   HDL 45 04/04/2016   LDLCALC 70 04/04/2016   TRIG 65 04/04/2016     Radiology Studies    Dg Chest 1 View  Result Date: 03/28/2016 CLINICAL DATA:  Respiratory distress.  Emesis. EXAM: CHEST 1 VIEW COMPARISON:  None. FINDINGS: Normal heart size and pulmonary vascularity. Diffuse perihilar peribronchial thickening with diffuse interstitial pattern to the lungs. This may represent interstitial pneumonia or edema. No blunting of costophrenic angles. No pneumothorax. Mediastinal contours appear intact. Calcification of the aorta. IMPRESSION: Peribronchial thickening with diffuse interstitial pattern to the lungs suggesting interstitial pneumonia or edema. Electronically Signed   By: Lucienne Capers M.D.   On: 03/28/2016 00:25   Dg Chest 2 View  Result Date: 04/10/2016 CLINICAL DATA:  Difficulty breathing.  Dyspnea on exertion. EXAM: CHEST  2 VIEW COMPARISON:  One-view chest x-ray 03/28/2016 FINDINGS: The heart is enlarged. Atherosclerotic calcifications are present at the aortic arch. Chronic interstitial coarsening is present.  Superimposed markings have increased at the lung bases. Bilateral pleural effusions are present. Lower lobe airspace disease better appreciated on the lateral view. Degenerative changes are present within the thoracic spine. IMPRESSION: 1. Increasing interstitial markings suggesting edema superimposed on chronic disease. 2. Lower lobe pneumonia best seen on the lateral view. 3. Bilateral pleural effusions. Imaging findings of potential clinical significance: Aortic atherosclerosis Electronically Signed   By: San Morelle M.D.   On: 04/10/2016 09:40   US Renal  Result Date: 03/30/2016 CLINICAL DATA:  Acute renal failure . EXAM: RENAL / URINARY TRACT ULTRASOUND COMPLETE COMPARISON:  None. FINDINGS: Right Kidney: Length: 12.2 cm. Echogenicity within normal limits. No hydronephrosis visualized. Two simple cysts in the right kidney largest measures 2.2 cm. Left Kidney: Length: 12.8 cm. Echogenicity within normal limits. No hydronephrosis visualized. A 2 cm simple cyst is noted in the left kidney. Bladder: Appears normal for degree of bladder distention. Foley catheter is in place. IMPRESSION: No acute abnormality identified. Simple cysts noted both kidneys. No hydronephrosis or bladder distention. Foley catheter is in place. Electronically Signed   By: Marcello Moores  Register   On: 03/30/2016 09:57    ECG & Cardiac Imaging    Sinus arrhythmia, 63, leftward axis, lateral biphasic T changes-less pronounced than on prior ECGs.  Assessment & Plan    1. Acute on chronic systolic and just heart failure/mixed ischemic and nonischemic cardiomyopathy: Patient recently admitted with heart failure requiring diuresis. EF 20-25% at that time. He was discharged at a weight of 166 pounds. When seen back in heart failure clinic on the 21st, weight was up to 171 pounds, though wife says that that was fully clothed w/ lifevest on.  Wt @ home had been stable.  He also had mild lower extremity swelling at that time. He was  advised to take an additional Lasix for weight gain. No other changes were made. Per pt/wife, wt remained stable @ home.  He did not require any additional lasix.  Began feeling DOE w/ inc abd girth beginning yesterday.  Orthopneic all night.  He now presents up 4 more pounds at 175 pounds per bed scale and markedly hypertensive.  CXR with edema.  He received  low dose IV lasix in the ED and is minus 650 so far.  Wt down on scale on floor.  Still with jvd.  Cont IV diuresis - will need higher dose given elevated creat.  It appears that he was on lasix 40 bid prior to d/c but then d/c'd on 20 daily.  Continue beta blocker, hydralazine, nitrates.  May require nephrology input.  2. Hypertensive urgency: BP elevated in setting of above.  Was 170's yesterday.  Cont diuresis, hydral/nitrate/ blocker/clonidine.  Will switch to IV ntg for now since bp still 190's.  3. Stage IV chronic kidney disease: Creatinine relatively stable since discharge. Follow with diuresis. May require nephrology input.  4. Elevated troponin/nonobstructive coronary artery disease: Recent catheterization revealed moderate nonobstructive CAD involving the LAD, circumflex, and right coronary artery. Medical therapy was recommended that time. Upon this presentation in the setting of hypertensive urgency, he again has mild troponin elevation. In light of recent catheterization and renal failure, would not pursue additional ischemic evaluation. Plan to continue aspirin, beta blocker, and statin therapy.  5.  ? PNA:  Afebrile.  Wbc nl.  abx per IM.  6.  VT:  On amio. lifevest in place.  Signed, Murray Hodgkins, NP 04/10/2016, 5:19 PM

## 2016-04-10 NOTE — Care Management Note (Signed)
Case Management Note  Patient Details  Name: Colton Thompson MRN: [id number] Date of Birth: October 23, 1945  Subjective/Objective:    Spoke to Violet at [facility], and they have no bed movement in foreseeable future. Perhaps might have availability over the weekend. Okay to admit here to [facility] MD made aware.                Action/Plan:   Expected Discharge Date:                  Expected Discharge Plan:     In-House Referral:     Discharge planning Services     Post Acute Care Choice:    Choice offered to:     DME Arranged:    DME Agency:     HH Arranged:    HH Agency:     Status of Service:     If discussed at [employer] of Stay Meetings, dates discussed:    Additional Comments:  Beau Fanny, RN 04/10/2016, 11:38 AM

## 2016-04-10 NOTE — Progress Notes (Signed)
Family Meeting Note  Advance Directive:no  Today a meeting took place with the Patient.spouse    The following clinical team members were present during this meeting:MD  The following were discussed:Patient's diagnosis: chf EF 20-25%, Patient's progosis: Unable to determine and Goals for treatment: Full Code  Additional follow-up to be provided: chaplain for advanced directives  Time spent during discussion:16 minutes  Chaney Ingram, Ulice Bold, MD

## 2016-04-10 NOTE — Consult Note (Signed)
Pharmacy Antibiotic Note  Colton Thompson is a 71 y.o. male admitted on 04/10/2016 with pneumonia.  Pharmacy has been consulted for zosyn dosing. Pt has systolic heart failure EF 20-25% was recently discharged from hospital due to exacerbation.   Plan: Zosyn 3.375g IV q8h (4 hour infusion).  Height: 6' (182.9 cm) Weight: 175 lb 7 oz (79.6 kg) IBW/kg (Calculated) : 77.6  Temp (24hrs), Avg:97.9 F (36.6 C), Min:97.9 F (36.6 C), Max:97.9 F (36.6 C)   Recent Labs Lab 04/04/16 0027 04/10/16 0828  WBC  --  6.6  CREATININE 3.18* 3.13*    Estimated Creatinine Clearance: 23.8 mL/min (by C-G formula based on SCr of 3.13 mg/dL (H)).    No Known Allergies  Antimicrobials this admission: zosyn 2/23 >>    Dose adjustments this admission:   Microbiology results:  2/23 MRSA PCR: pending 2/23 PCT: pending Chest x-ray:  1. Increasing interstitial markings suggesting edema superimposed on chronic disease. 2. Lower lobe pneumonia best seen on the lateral view. 3. Bilateral pleural effusions. Imaging findings of potential clinical significance:  Thank you for allowing pharmacy to be a part of this patient's care.  Ramond Dial, Pharm.D, BCPS Clinical Pharmacist  04/10/2016 2:48 PM

## 2016-04-10 NOTE — H&P (Signed)
[place] at [place] NAME: Colton Thompson    MR#:  [id number]  DATE OF BIRTH:  [birth year]  DATE OF ADMISSION:  04/10/2016  PRIMARY CARE PHYSICIAN: Marsh Dolly, MD   REQUESTING/REFERRING PHYSICIAN: dr Joni Fears  CHIEF COMPLAINT:   SOB HISTORY OF PRESENT ILLNESS:  Colton Thompson  is a 71 y.o. male with a known history of Chronic combined systolic and diastolic heart failure ejection fraction 20-25% with recent cardiac catheterization showing moderate three-vessel disease on optimal medical therapy and chronic kidney disease stage 4 who presents with increasing shortness of breath and weight gain over the past few days. Patient reports he has had a 7 pound weight gain since his discharge from the hospital on every 17th. At that time he was admitted for acute on chronic combined heart failure as well as elevation in troponin which was thought to be due to demand ischemia. He also had episode of nonsustained V. tach was discharged with a life vest. At his discharge he was not short of breath and feeling back at baseline. He is compliant with his medications and diet.  PAST MEDICAL HISTORY:   Past Medical History:  Diagnosis Date  . CHF (congestive heart failure) ([place])   . Chronic kidney disease   . DM (diabetes mellitus), type 2 ([place])   . HTN (hypertension)   . Obstructive sleep apnea     PAST SURGICAL HISTORY:   Past Surgical History:  Procedure Laterality Date  . ABDOMINAL HERNIA REPAIR    . CARDIAC CATHETERIZATION    . RIGHT/LEFT HEART CATH AND CORONARY ANGIOGRAPHY N/A 04/03/2016   Procedure: Right/Left Heart Cath and Coronary Angiography;  Surgeon: Wellington Hampshire, MD;  Location: [facility];  Service: Cardiovascular;  Laterality: N/A;    SOCIAL HISTORY:   Social History  Substance Use Topics  . Smoking status: Former Smoker    Packs/day: 0.50    Years: 50.00    Types: Cigarettes    Start date: 02/17/1968    Quit date:  03/25/2016  . Smokeless tobacco: Never Used  . Alcohol use No    FAMILY HISTORY:   Family History  Problem Relation Age of Onset  . Stroke Mother   . Stroke Father   . Hypertension Father   . Cancer Brother     Bone (Jaw)    DRUG ALLERGIES:  No Known Allergies  REVIEW OF SYSTEMS:   Review of Systems  Constitutional: Negative.  Negative for chills, fever and malaise/fatigue.  HENT: Negative.  Negative for ear discharge, ear pain, hearing loss, nosebleeds and sore throat.   Eyes: Negative.  Negative for blurred vision and pain.  Respiratory: Positive for cough and shortness of breath. Negative for hemoptysis and wheezing.   Cardiovascular: Positive for orthopnea. Negative for chest pain, palpitations and leg swelling.  Gastrointestinal: Negative.  Negative for abdominal pain, blood in stool, diarrhea, nausea and vomiting.  Genitourinary: Negative.  Negative for dysuria.  Musculoskeletal: Negative.  Negative for back pain.  Skin: Negative.   Neurological: Negative for dizziness, tremors, speech change, focal weakness, seizures and headaches.  Endo/Heme/Allergies: Negative.  Does not bruise/bleed easily.  Psychiatric/Behavioral: Negative.  Negative for depression, hallucinations and suicidal ideas.    MEDICATIONS AT HOME:   Prior to Admission medications   Medication Sig Start Date End Date Taking? Authorizing Provider  amiodarone (PACERONE) 200 MG tablet Take 1 tablet (200 mg total) by mouth 2 (two) times daily. 04/04/16  Yes Vaughan Basta, MD  aspirin EC 81 MG EC tablet Take 1 tablet (81 mg total) by mouth daily. 04/05/16  Yes Vaughan Basta, MD  carvedilol (COREG) 6.25 MG tablet Take 1 tablet (6.25 mg total) by mouth 2 (two) times daily with a meal. 04/04/16  Yes Vaughan Basta, MD  cloNIDine (CATAPRES) 0.1 MG tablet Take 1 tablet (0.1 mg total) by mouth 2 (two) times daily. 04/04/16  Yes Vaughan Basta, MD  furosemide (LASIX) 20 MG tablet Take 20 mg  by mouth daily.   Yes Historical Provider, MD  hydrALAZINE (APRESOLINE) 100 MG tablet Take 100 mg by mouth 3 (three) times daily.   Yes Historical Provider, MD  insulin glargine (LANTUS) 100 UNIT/ML injection Inject 15 Units into the skin at bedtime.   Yes Historical Provider, MD  isosorbide mononitrate (IMDUR) 60 MG 24 hr tablet Take 1 tablet (60 mg total) by mouth 2 (two) times daily. 04/04/16  Yes Vaughan Basta, MD  omeprazole (PRILOSEC) 20 MG capsule Take 20 mg by mouth 2 (two) times daily before a meal.   Yes Historical Provider, MD  pravastatin (PRAVACHOL) 40 MG tablet Take 40 mg by mouth daily.   Yes Historical Provider, MD  tamsulosin (FLOMAX) 0.4 MG CAPS capsule Take 1 capsule (0.4 mg total) by mouth daily. 04/05/16  Yes Vaughan Basta, MD  vitamin B-12 (CYANOCOBALAMIN) 1000 MCG tablet Take 1,000 mcg by mouth daily.   Yes Historical Provider, MD  albuterol (PROVENTIL HFA;VENTOLIN HFA) 108 (90 Base) MCG/ACT inhaler Inhale 1-2 puffs into the lungs every 6 (six) hours as needed for wheezing or shortness of breath.    Historical Provider, MD      VITAL SIGNS:  Blood pressure (!) 179/80, pulse (!) 52, temperature 97.9 F (36.6 C), temperature source Oral, resp. rate 18, height 6' (1.829 m), weight 79.6 kg (175 lb 7 oz), SpO2 95 %.  PHYSICAL EXAMINATION:   Physical Exam  Constitutional: He is oriented to person, place, and time and well-developed, well-nourished, and in no distress. No distress.  HENT:  Head: Normocephalic.  Eyes: No scleral icterus.  Neck: Normal range of motion. Neck supple. No JVD present. No tracheal deviation present.  Cardiovascular: Normal rate and regular rhythm.  Exam reveals no gallop and no friction rub.   Murmur heard. Pulmonary/Chest: Effort normal and breath sounds normal. No respiratory distress. He has no wheezes. He has no rales. He exhibits no tenderness.  Crackles at bases left greater than right  Abdominal: Soft. Bowel sounds are  normal. He exhibits no distension and no mass. There is no tenderness. There is no rebound and no guarding.  Musculoskeletal: Normal range of motion. He exhibits no edema.  Neurological: He is alert and oriented to person, place, and time.  Skin: Skin is warm. No rash noted. No erythema.  Psychiatric: Affect and judgment normal.      LABORATORY PANEL:   CBC  Recent Labs Lab 04/10/16 0828  WBC 6.6  HGB 12.1*  HCT 34.6*  PLT 236   ------------------------------------------------------------------------------------------------------------------  Chemistries   Recent Labs Lab 04/10/16 0828  NA 135  K 4.8  CL 101  CO2 26  GLUCOSE 178*  BUN 40*  CREATININE 3.13*  CALCIUM 8.5*   ------------------------------------------------------------------------------------------------------------------  Cardiac Enzymes  Recent Labs Lab 04/10/16 0828  TROPONINI 0.05*   ------------------------------------------------------------------------------------------------------------------  RADIOLOGY:  Dg Chest 2 View  Result Date: 04/10/2016 CLINICAL DATA:  Difficulty breathing.  Dyspnea on exertion. EXAM: CHEST  2 VIEW COMPARISON:  One-view chest x-ray 03/28/2016 FINDINGS: The heart is enlarged.  Atherosclerotic calcifications are present at the aortic arch. Chronic interstitial coarsening is present. Superimposed markings have increased at the lung bases. Bilateral pleural effusions are present. Lower lobe airspace disease better appreciated on the lateral view. Degenerative changes are present within the thoracic spine. IMPRESSION: 1. Increasing interstitial markings suggesting edema superimposed on chronic disease. 2. Lower lobe pneumonia best seen on the lateral view. 3. Bilateral pleural effusions. Imaging findings of potential clinical significance: Aortic atherosclerosis Electronically Signed   By: San Morelle M.D.   On: 04/10/2016 09:40    EKG:  Normal sinus rhythm with  T-wave inversions in lateral leads nonspecific  IMPRESSION AND PLAN:   71 year old male with a history combined systolic and diastolic heart failure with ejection fraction of 25%, chronic kidney disease stage 4 who presents with acute on chronic congestive heart failure.  1. Acute on chronic combined systolic and diastolic heart failure with ejection fraction 20-25% and recent cardiac catheterization showing moderate three-vessel disease Continue IV Lasix Monitor intake and output Monitor BMP Cardiology consult  2. Elevation in troponin most likely due to demand ischemia  3. Accelerated essential hypertension: Increase clonidine, continue Coreg, hydralazine and isosorbide  4. History of nonsustained V. tach on amiodarone and LifeVest  5. Diabetes: Continue Lantus with ADA diet and sliding scale insulin Diabetes coordinator consultation  6. Chronic kidney disease stage IV: Creatinine appears to be at baseline  7. HCAP : start Zosyn Order MRSA PCR if + start Vanc as well  All the records are reviewed and case discussed with ED provider. Management plans discussed with the patient and he is in agreement  CODE STATUS: FULL  TOTAL TIME TAKING CARE OF THIS PATIENT: 45 minutes.    Juno Bozard M.D on 04/10/2016 at 12:18 PM  Between 7am to 6pm - Pager - [phone]  After 6pm go to [email or web address] - password [email or web address] [facility]  Office  [phone]  CC: Primary care physician; Marsh Dolly, MD

## 2016-04-10 NOTE — Care Management (Addendum)
Patient was discharged from [facility] to home with [facility] and [facility].    He is admitted with shortness of breath. Possible pneumonia and acute on chronic heart failure.  No temp or elevated WBC.  Blood pressure over 656 systolic.  Spoke with [facility].

## 2016-04-11 ENCOUNTER — Inpatient Hospital Stay: Payer: Medicare Other

## 2016-04-11 DIAGNOSIS — I5020 Unspecified systolic (congestive) heart failure: Secondary | ICD-10-CM

## 2016-04-11 LAB — BASIC METABOLIC PANEL
Anion gap: 8 (ref 5–15)
BUN: 40 mg/dL — AB (ref 6–20)
CHLORIDE: 102 mmol/L (ref 101–111)
CO2: 25 mmol/L (ref 22–32)
Calcium: 8.5 mg/dL — ABNORMAL LOW (ref 8.9–10.3)
Creatinine, Ser: 3.02 mg/dL — ABNORMAL HIGH (ref 0.61–1.24)
GFR calc Af Amer: 22 mL/min — ABNORMAL LOW (ref >60)
GFR calc non Af Amer: 19 mL/min — ABNORMAL LOW (ref >60)
GLUCOSE: 94 mg/dL (ref 65–99)
POTASSIUM: 4.3 mmol/L (ref 3.5–5.1)
Sodium: 135 mmol/L (ref 135–145)

## 2016-04-11 LAB — CBC
HEMATOCRIT: 32.5 % — AB (ref 40.0–52.0)
Hemoglobin: 11.2 g/dL — ABNORMAL LOW (ref 13.0–18.0)
MCH: 30.3 pg (ref 26.0–34.0)
MCHC: 34.5 g/dL (ref 32.0–36.0)
MCV: 87.7 fL (ref 80.0–100.0)
Platelets: 210 10*3/uL (ref 150–440)
RBC: 3.71 MIL/uL — ABNORMAL LOW (ref 4.40–5.90)
RDW: 14.8 % — AB (ref 11.5–14.5)
WBC: 6.4 10*3/uL (ref 3.8–10.6)

## 2016-04-11 LAB — GLUCOSE, CAPILLARY
GLUCOSE-CAPILLARY: 152 mg/dL — AB (ref 65–99)
Glucose-Capillary: 96 mg/dL (ref 65–99)
Glucose-Capillary: 154 mg/dL — ABNORMAL HIGH (ref 65–99)
Glucose-Capillary: 128 mg/dL — ABNORMAL HIGH (ref 65–99)

## 2016-04-11 MED ORDER — LEVOFLOXACIN IN D5W 750 MG/150ML IV SOLN
750.0000 mg | INTRAVENOUS | Status: DC
  Filled 2016-04-11: qty 150

## 2016-04-11 MED ORDER — LEVOFLOXACIN 500 MG PO TABS
750.0000 mg | ORAL_TABLET | ORAL | Status: DC
  Administered 2016-04-11 – 2016-04-13 (×2): 750 mg via ORAL
  Filled 2016-04-11 (×2): qty 2

## 2016-04-11 NOTE — Progress Notes (Signed)
Progress Note  Patient Name: Colton Thompson Date of Encounter: 04/11/2016  Primary Cardiologist: Rockey Situ  Subjective   Denies chest pain or sob.   Inpatient Medications    Scheduled Meds: . amiodarone  200 mg Oral BID  . aspirin EC  81 mg Oral Daily  . carvedilol  6.25 mg Oral BID WC  . cloNIDine  0.2 mg Oral BID  . enoxaparin (LOVENOX) injection  30 mg Subcutaneous Q24H  . furosemide  40 mg Intravenous BID  . hydrALAZINE  100 mg Oral TID  . insulin aspart  0-5 Units Subcutaneous QHS  . insulin aspart  0-9 Units Subcutaneous TID WC  . insulin glargine  15 Units Subcutaneous QHS  . pantoprazole  40 mg Oral Daily  . piperacillin-tazobactam (ZOSYN)  IV  3.375 g Intravenous Q8H  . pravastatin  40 mg Oral q1800  . sodium chloride flush  3 mL Intravenous Q12H  . sodium chloride flush  3 mL Intravenous Q12H  . tamsulosin  0.4 mg Oral Daily  . vitamin B-12  1,000 mcg Oral Daily   Continuous Infusions:  PRN Meds: sodium chloride, acetaminophen **OR** acetaminophen, albuterol, hydrALAZINE, ondansetron **OR** ondansetron (ZOFRAN) IV, senna-docusate, traMADol   Vital Signs    Vitals:   04/10/16 1703 04/10/16 1720 04/10/16 1954 04/11/16 0443  BP:  (!) 146/54 (!) 165/65 (!) 162/77  Pulse:   (!) 57 (!) 56  Resp:   16 14  Temp:   97.7 F (36.5 C) 97.8 F (36.6 C)  TempSrc:   Oral   SpO2:   98% 96%  Weight: 169 lb 6.4 oz (76.8 kg)   167 lb 14.4 oz (76.2 kg)  Height:        Intake/Output Summary (Last 24 hours) at 04/11/16 0758 Last data filed at 04/11/16 0700  Gross per 24 hour  Intake              340 ml  Output             2275 ml  Net            -1935 ml   Filed Weights   04/10/16 0826 04/10/16 1703 04/11/16 0443  Weight: 175 lb 7 oz (79.6 kg) 169 lb 6.4 oz (76.8 kg) 167 lb 14.4 oz (76.2 kg)    Telemetry    Marked sinus bradycardia - Personally Reviewed    Physical Exam   GEN: No acute distress.   Neck: No JVD Cardiac: Regular bradycardia, no murmurs,  rubs, or gallops.  Respiratory: Clear to auscultation bilaterally. GI: Soft, nontender, non-distended  MS: No edema; No deformity. Neuro:  Nonfocal  Psych: Normal affect   Labs    Chemistry Recent Labs Lab 04/10/16 0828 04/11/16 0543  NA 135 135  K 4.8 4.3  CL 101 102  CO2 26 25  GLUCOSE 178* 94  BUN 40* 40*  CREATININE 3.13* 3.02*  CALCIUM 8.5* 8.5*  GFRNONAA 19* 19*  GFRAA 21* 22*  ANIONGAP 8 8     Hematology Recent Labs Lab 04/10/16 0828 04/11/16 0543  WBC 6.6 6.4  RBC 3.92* 3.71*  HGB 12.1* 11.2*  HCT 34.6* 32.5*  MCV 88.1 87.7  MCH 30.8 30.3  MCHC 35.0 34.5  RDW 14.7* 14.8*  PLT 236 210    Cardiac Enzymes Recent Labs Lab 04/10/16 0828  TROPONINI 0.05*   No results for input(s): TROPIPOC in the last 168 hours.   BNP Recent Labs Lab 04/10/16 0828  BNP 909.0*  DDimer No results for input(s): DDIMER in the last 168 hours.   Radiology    Dg Chest 2 View  Result Date: 04/10/2016 CLINICAL DATA:  Difficulty breathing.  Dyspnea on exertion. EXAM: CHEST  2 VIEW COMPARISON:  One-view chest x-ray 03/28/2016 FINDINGS: The heart is enlarged. Atherosclerotic calcifications are present at the aortic arch. Chronic interstitial coarsening is present. Superimposed markings have increased at the lung bases. Bilateral pleural effusions are present. Lower lobe airspace disease better appreciated on the lateral view. Degenerative changes are present within the thoracic spine. IMPRESSION: 1. Increasing interstitial markings suggesting edema superimposed on chronic disease. 2. Lower lobe pneumonia best seen on the lateral view. 3. Bilateral pleural effusions. Imaging findings of potential clinical significance: Aortic atherosclerosis Electronically Signed   By: San Morelle M.D.   On: 04/10/2016 09:40     Patient Profile     71 y.o. male with a prior history of severe hypertension, systolic heart failure, nonobstructive CAD, mixed ischemic and nonischemic  cardiomyopathy myopathy, stage IV chronic kidney disease, sleep apnea, and tobacco abuse, who was recently discharged following heart failure admission, and presented to the ED February 23rd with recurrent dyspnea and weight gain.   Assessment & Plan    1  Acute on chroniic systolic CHF (ischmic,nonischemic)  LVEF 20 to 25%  Recent admit  Diruesed  Dry wt 166.  Wt admit 175. He appears to be back to baseline from a volume standpoint. Continue current meds except coreg and clonidine held.  2  HTN urgency - blood pressure is still up. He will need additional medical therapy. He will probably need to hold clonidine as well.  3 CKD  Stage IV    4  Elev trop.  REcent cath showed mod dz in LAD, LCx and RCA  Rec Medical Rx     5. Sinus node dysfunction - he has had profound sinus brady on tele. He will have is coreg and clonidine held.  Signed, Cristopher Peru, MD  04/11/2016, 7:58 AM

## 2016-04-11 NOTE — Progress Notes (Signed)
Patients HR sustaining in 30s., VSS and patient nonsymptomatic.  Patient HR now in 18s Cardiology notified and will take a look at strips when arrives on unit. Per cardio ok to give amio and hydralazine doses.

## 2016-04-11 NOTE — Progress Notes (Addendum)
Patient states feels he feels like he has to urinate but cannot. Bladder scanned patient which showed 360mls. MD notified, orders given to discontinue furosemide.Will continue to monitor.

## 2016-04-11 NOTE — Progress Notes (Signed)
Pharmacy Antibiotic Note  Colton Thompson is a 71 y.o. male admitted on 04/10/2016 with pneumonia.  Pharmacy has been consulted for levofloxacin dosing.  Plan: Levofloxacin 750 mg PO every 48 hours.  Height: 6' (182.9 cm) Weight: 167 lb 14.4 oz (76.2 kg) IBW/kg (Calculated) : 77.6  Temp (24hrs), Avg:97.9 F (36.6 C), Min:97.7 F (36.5 C), Max:98.1 F (36.7 C)   Recent Labs Lab 04/10/16 0828 04/11/16 0543  WBC 6.6 6.4  CREATININE 3.13* 3.02*    Estimated Creatinine Clearance: 24.2 mL/min (by C-G formula based on SCr of 3.02 mg/dL (H)).    No Known Allergies  Antimicrobials this admission: Piperacillin/tazobactam 2/23 >> 2/24 levofloxacin 2/24 >>   Dose adjustments this admission:  Microbiology results:  Thank you for allowing pharmacy to be a part of this patient's care.  Lenis Noon, PharmD Clinical Pharmacist 04/11/2016 9:59 AM

## 2016-04-11 NOTE — Progress Notes (Signed)
[facility] at [place] NAME: Colton Thompson    MR#:  [id number]  DATE OF BIRTH:  05/06/1945  SUBJECTIVE:   Patient here due to shortness of breath secondary to CHF. Feels better since yesterday. Noted to be significantly bradycardic with heart rates in the low 30s but hemodynamically stable and alert and oriented. No other acute events overnight.  REVIEW OF SYSTEMS:    Review of Systems  Constitutional: Negative for chills and fever.  HENT: Negative for congestion and tinnitus.   Eyes: Negative for blurred vision and double vision.  Respiratory: Negative for cough, shortness of breath and wheezing.   Cardiovascular: Negative for chest pain, orthopnea and PND.  Gastrointestinal: Negative for abdominal pain, diarrhea, nausea and vomiting.  Genitourinary: Negative for dysuria and hematuria.  Neurological: Negative for dizziness, sensory change and focal weakness.  All other systems reviewed and are negative.   Nutrition: heart healthy/Carb control Tolerating Diet: Yes Tolerating PT: Await Eval  DRUG ALLERGIES:  No Known Allergies  VITALS:  Blood pressure 120/61, pulse (!) 29, temperature 97.4 F (36.3 C), temperature source Oral, resp. rate 18, height 6' (1.829 m), weight 76.2 kg (167 lb 14.4 oz), SpO2 98 %.  PHYSICAL EXAMINATION:   Physical Exam  GENERAL:  71 y.o.-year-old patient lying in bed in no acute distress.  EYES: Pupils equal, round, reactive to light and accommodation. No scleral icterus. Extraocular muscles intact.  HEENT: Head atraumatic, normocephalic. Oropharynx and nasopharynx clear.  NECK:  Supple, no jugular venous distention. No thyroid enlargement, no tenderness.  LUNGS: Normal breath sounds bilaterally, no wheezing, rales, rhonchi. No use of accessory muscles of respiration.  CARDIOVASCULAR: S1, S2 Bradycardic. No murmurs, rubs, or gallops.  ABDOMEN: Soft, nontender, nondistended. Bowel sounds present. No  organomegaly or mass.  EXTREMITIES: No cyanosis, clubbing, + 1 edema b/l NEUROLOGIC: Cranial nerves II through XII are intact. No focal Motor or sensory deficits b/l.  Globally weak.  PSYCHIATRIC: The patient is alert and oriented x 3.  SKIN: No obvious rash, lesion, or ulcer.    LABORATORY PANEL:   CBC  Recent Labs Lab 04/11/16 0543  WBC 6.4  HGB 11.2*  HCT 32.5*  PLT 210   ------------------------------------------------------------------------------------------------------------------  Chemistries   Recent Labs Lab 04/11/16 0543  NA 135  K 4.3  CL 102  CO2 25  GLUCOSE 94  BUN 40*  CREATININE 3.02*  CALCIUM 8.5*   ------------------------------------------------------------------------------------------------------------------  Cardiac Enzymes  Recent Labs Lab 04/10/16 0828  TROPONINI 0.05*   ------------------------------------------------------------------------------------------------------------------  RADIOLOGY:  X-ray Chest Pa And Lateral  Result Date: 04/11/2016 CLINICAL DATA:  Acute on chronic congestive heart failure. EXAM: CHEST  2 VIEW COMPARISON:  PA and lateral chest 04/10/2016. FINDINGS: Pulmonary edema is markedly improved. Small bilateral pleural effusions are seen. Heart size is upper normal. Aortic atherosclerosis. Multiple leads overlie the patient. IMPRESSION: Marked improvement in pulmonary edema. Small bilateral pleural effusions. Electronically Signed   By: Inge Rise M.D.   On: 04/11/2016 09:09   Dg Chest 2 View  Result Date: 04/10/2016 CLINICAL DATA:  Difficulty breathing.  Dyspnea on exertion. EXAM: CHEST  2 VIEW COMPARISON:  One-view chest x-ray 03/28/2016 FINDINGS: The heart is enlarged. Atherosclerotic calcifications are present at the aortic arch. Chronic interstitial coarsening is present. Superimposed markings have increased at the lung bases. Bilateral pleural effusions are present. Lower lobe airspace disease better  appreciated on the lateral view. Degenerative changes are present within the thoracic spine. IMPRESSION: 1. Increasing interstitial markings  suggesting edema superimposed on chronic disease. 2. Lower lobe pneumonia best seen on the lateral view. 3. Bilateral pleural effusions. Imaging findings of potential clinical significance: Aortic atherosclerosis Electronically Signed   By: San Morelle M.D.   On: 04/10/2016 09:40     ASSESSMENT AND PLAN:   71 year old male with past medical history of non-ischemic cardiomyopathy ejection fraction of 20-30%, history of CHF, diabetes, kidney disease stage IV, DM, HTN who presented to the hospital due to shortness of breath.   1. Acute on chronic systolic CHF-continue diuresis with IV Lasix, follow I's and O's and daily weights. -clinically improved since yesterday. Appreciate Cards input.  - about 2L (-) since yesterday.  - hold B-blocker due to severe Bradycardia.   2. Bradycardia - sinus node dysfunction.  Hemodynamically stable.  - HR in the low 30's.  Hold Coreg, Amio, Clonidine.  - appreciate Cards input.   3. Pneumonia - CAP. Incidentally noted on CXR on admission.  - cont. Levaquin. Afebrile, hemodynamically stable.   4. CKD Stage IV - Cr. Close to baseline and will cont. To monitor.   5. Hyperlipidemia - cont. Pravachol.   6. GERD - cont. Protonix.   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 Lantus, SSI.  - follow BS.    All the records are reviewed and case discussed with Care Management/Social Worker. Management plans discussed with the patient, family and they are in agreement.  CODE STATUS: Full Code  DVT Prophylaxis: Lovenox  TOTAL TIME TAKING CARE OF THIS PATIENT: 30 minutes.   POSSIBLE D/C IN 1-2 DAYS, DEPENDING ON CLINICAL CONDITION.   [name] M.D on 04/11/2016 at 2:36 PM  Between 7am to 6pm - Pager - [phone]  After 6pm go to [email or web address] - [employer] [facility]  Office  [phone]  CC: Primary care physician; Marsh Dolly, MD

## 2016-04-12 DIAGNOSIS — I495 Sick sinus syndrome: Secondary | ICD-10-CM

## 2016-04-12 LAB — GLUCOSE, CAPILLARY
GLUCOSE-CAPILLARY: 117 mg/dL — AB (ref 65–99)
GLUCOSE-CAPILLARY: 101 mg/dL — AB (ref 65–99)
Glucose-Capillary: 102 mg/dL — ABNORMAL HIGH (ref 65–99)
Glucose-Capillary: 79 mg/dL (ref 65–99)

## 2016-04-12 LAB — PROCALCITONIN

## 2016-04-12 MED ORDER — ISOSORBIDE DINITRATE ER 40 MG PO TBCR
40.0000 mg | EXTENDED_RELEASE_TABLET | Freq: Two times a day (BID) | ORAL | Status: DC
  Filled 2016-04-12 (×2): qty 1

## 2016-04-12 MED ORDER — MORPHINE SULFATE (PF) 4 MG/ML IV SOLN
1.0000 mg | INTRAVENOUS | Status: DC | PRN
  Administered 2016-04-12: 1 mg via INTRAVENOUS
  Filled 2016-04-12: qty 1

## 2016-04-12 MED ORDER — NITROGLYCERIN 2 % TD OINT
1.0000 [in_us] | TOPICAL_OINTMENT | Freq: Four times a day (QID) | TRANSDERMAL | Status: DC
  Administered 2016-04-12: 1 [in_us] via TOPICAL
  Filled 2016-04-12: qty 1

## 2016-04-12 MED ORDER — LORAZEPAM 2 MG/ML IJ SOLN
0.5000 mg | Freq: Four times a day (QID) | INTRAMUSCULAR | Status: DC | PRN
  Administered 2016-04-12: 0.5 mg via INTRAVENOUS
  Filled 2016-04-12: qty 1

## 2016-04-12 MED ORDER — AMLODIPINE BESYLATE 10 MG PO TABS
10.0000 mg | ORAL_TABLET | Freq: Every day | ORAL | Status: DC
  Administered 2016-04-13 – 2016-04-14 (×2): 10 mg via ORAL
  Filled 2016-04-12 (×3): qty 1

## 2016-04-12 MED ORDER — MORPHINE SULFATE (CONCENTRATE) 10 MG/0.5ML PO SOLN: 10.0000 mg | ORAL | Status: DC | PRN

## 2016-04-12 NOTE — Progress Notes (Signed)
Patient connected to dynamap for closure monitoring of blood pressure. BP still remains 301-499U systolic. Regular scheduled dose of hydralazine given and cardiologist notified of situation. Will continue to monitor closely.

## 2016-04-12 NOTE — Progress Notes (Signed)
Patient complaining of shortness of breath. 02 saturation highs 90. Lung sounds clear diminished. Sat patient up. Respiratory gave breathing treatment. Patient calm at this time. Complained of shortness of breath again. No changes in prior assessment. Md notified. Patient placed on high flow nasal cannula. Tolerating well. Given ativan. Tolerated well. Morning BP check elevated. Manual BP elevated x2. MD notified. Ordered to stop high flow cannula and place patient on nasal cannula. Will monitor.

## 2016-04-12 NOTE — Progress Notes (Signed)
[facility] at [place] NAME: Colton Thompson    MR#:  [id number]  DATE OF BIRTH:  11-07-1945  SUBJECTIVE:   Patient here due to shortness of breath secondary to CHF.  Noted to be severely hypertensive this a.m. Also became quite short of breath last night but was not hypoxic.  Had some N/V this afternoon.  Remains significantly bradycardic  REVIEW OF SYSTEMS:    Review of Systems  Constitutional: Negative for chills and fever.  HENT: Negative for congestion and tinnitus.   Eyes: Negative for blurred vision and double vision.  Respiratory: Positive for shortness of breath. Negative for cough and wheezing.   Cardiovascular: Negative for chest pain, orthopnea and PND.  Gastrointestinal: Positive for nausea and vomiting. Negative for abdominal pain and diarrhea.  Genitourinary: Negative for dysuria and hematuria.  Neurological: Negative for dizziness, sensory change and focal weakness.  All other systems reviewed and are negative.   Nutrition: heart healthy/Carb control Tolerating Diet: Yes Tolerating PT: Await Eval  DRUG ALLERGIES:  No Known Allergies  VITALS:  Blood pressure (!) 160/54, pulse (!) 34, temperature 98.4 F (36.9 C), temperature source Oral, resp. rate 16, height 6' (1.829 m), weight 76.2 kg (167 lb 14.4 oz), SpO2 97 %.  PHYSICAL EXAMINATION:   Physical Exam  GENERAL:  71 y.o.-year-old patient lying in bed in no acute distress.  EYES: Pupils equal, round, reactive to light and accommodation. No scleral icterus. Extraocular muscles intact.  HEENT: Head atraumatic, normocephalic. Oropharynx and nasopharynx clear.  NECK:  Supple, no jugular venous distention. No thyroid enlargement, no tenderness.  LUNGS: Normal breath sounds bilaterally, no wheezing, rales, rhonchi. No use of accessory muscles of respiration.  CARDIOVASCULAR: S1, S2 Bradycardic. No murmurs, rubs, or gallops.  ABDOMEN: Soft, nontender, nondistended. Bowel  sounds present. No organomegaly or mass.  EXTREMITIES: No cyanosis, clubbing, + 1 edema b/l NEUROLOGIC: Cranial nerves II through XII are intact. No focal Motor or sensory deficits b/l.  Globally weak.  PSYCHIATRIC: The patient is alert and oriented x 3.  SKIN: No obvious rash, lesion, or ulcer.    LABORATORY PANEL:   CBC  Recent Labs Lab 04/11/16 0543  WBC 6.4  HGB 11.2*  HCT 32.5*  PLT 210   ------------------------------------------------------------------------------------------------------------------  Chemistries   Recent Labs Lab 04/11/16 0543  NA 135  K 4.3  CL 102  CO2 25  GLUCOSE 94  BUN 40*  CREATININE 3.02*  CALCIUM 8.5*   ------------------------------------------------------------------------------------------------------------------  Cardiac Enzymes  Recent Labs Lab 04/10/16 0828  TROPONINI 0.05*   ------------------------------------------------------------------------------------------------------------------  RADIOLOGY:  X-ray Chest Pa And Lateral  Result Date: 04/11/2016 CLINICAL DATA:  Acute on chronic congestive heart failure. EXAM: CHEST  2 VIEW COMPARISON:  PA and lateral chest 04/10/2016. FINDINGS: Pulmonary edema is markedly improved. Small bilateral pleural effusions are seen. Heart size is upper normal. Aortic atherosclerosis. Multiple leads overlie the patient. IMPRESSION: Marked improvement in pulmonary edema. Small bilateral pleural effusions. Electronically Signed   By: Inge Rise M.D.   On: 04/11/2016 09:09     ASSESSMENT AND PLAN:   71 year old male with past medical history of non-ischemic cardiomyopathy ejection fraction of 20-30%, history of CHF, diabetes, kidney disease stage IV, DM, HTN who presented to the hospital due to shortness of breath.   1. Acute on chronic systolic CHF- much improved w/ Diuresis and Lasix has been discontinued now.  - about 2L (-) since admission.  - hold B-blocker due to severe  Bradycardia.  2. Shortness of breath-combination of underlying CHF and also air hunger from underlying COPD. -No evidence of acute COPD exacerbation. We'll add some oral Roxanol for air hunger. No hypoxemia noted.  3. Bradycardia - continues to be significantly bradycardic with heart rates in the 30s. Hemodynamically stable. - cont. Hold Coreg, Amio, Clonidine.  - appreciate Cards input and no plans for any intervention at this time.   4. Accelerated HTN - patient noted to be significantly hypertensive this morning. This is likely rebound hypertension from patient's clonidine being discontinued due to bradycardia. -Patient given some IV hydralazine, oral hydralazine and also Nitropaste placed. Blood pressure has improved. - cont. Hydralazine, Norvasc for now.   5. Pneumonia - CAP. Incidentally noted on CXR on admission.  - cont. Levaquin. Afebrile, hemodynamically stable.   6. CKD Stage IV - Cr. Close to baseline and will cont. To monitor.   7. Hyperlipidemia - cont. Pravachol.   8. GERD - cont. Protonix.   8. DM Type II w/ renal complications - cont. Lantus, SSI. BS stable.    All the records are reviewed and case discussed with Care Management/Social Worker. Management plans discussed with the patient, family and they are in agreement.  CODE STATUS: Full Code  DVT Prophylaxis: Lovenox  TOTAL TIME TAKING CARE OF THIS PATIENT: 30 minutes.   POSSIBLE D/C IN 1-2 DAYS, DEPENDING ON CLINICAL CONDITION.   Colton Thompson M.D on 04/12/2016 at 2:01 PM  Between 7am to 6pm - Pager - [phone]  After 6pm go to [email or web address] - [employer] [facility]  Office  [phone]  CC: Primary care physician; Marsh Dolly, MD

## 2016-04-12 NOTE — Progress Notes (Signed)
Patient c/o nausea, shortness of breath  and had one episode of vomiting. Zofran given along with morphine for air hunger. BP systolic now in 643D. Per MD hold dose of amlodipine.

## 2016-04-12 NOTE — Progress Notes (Signed)
Patient BP 741/423T systolic after IV hydralazine. MD notified. Nitropaste ordered, Will give and continue to monitor.

## 2016-04-12 NOTE — Progress Notes (Signed)
Progress Note  Patient Name: MAYA SCHOLER Date of Encounter: 04/12/2016  Primary Cardiologist: Dr. Fletcher Anon  Subjective   C/o feeling a little sob.  Inpatient Medications    Scheduled Meds: . aspirin EC  81 mg Oral Daily  . enoxaparin (LOVENOX) injection  30 mg Subcutaneous Q24H  . hydrALAZINE  100 mg Oral TID  . insulin aspart  0-5 Units Subcutaneous QHS  . insulin aspart  0-9 Units Subcutaneous TID WC  . insulin glargine  15 Units Subcutaneous QHS  . levofloxacin  750 mg Oral Q48H  . nitroGLYCERIN  1 inch Topical Q6H  . pantoprazole  40 mg Oral Daily  . pravastatin  40 mg Oral q1800  . sodium chloride flush  3 mL Intravenous Q12H  . sodium chloride flush  3 mL Intravenous Q12H  . tamsulosin  0.4 mg Oral Daily  . vitamin B-12  1,000 mcg Oral Daily   Continuous Infusions:  PRN Meds: sodium chloride, acetaminophen **OR** acetaminophen, albuterol, hydrALAZINE, LORazepam, morphine injection, ondansetron **OR** ondansetron (ZOFRAN) IV, senna-docusate, traMADol   Vital Signs    Vitals:   04/12/16 0652 04/12/16 0808 04/12/16 0832 04/12/16 0835  BP: (!) 202/50 (!) 196/52 (!) 200/77 (!) 197/59  Pulse: (!) 40 (!) 37 69 71  Resp:  17    Temp:  97.6 F (36.4 C)    TempSrc:  Oral    SpO2:  93%    Weight:      Height:        Intake/Output Summary (Last 24 hours) at 04/12/16 0919 Last data filed at 04/12/16 0838  Gross per 24 hour  Intake              720 ml  Output              775 ml  Net              -55 ml   Filed Weights   04/10/16 0826 04/10/16 1703 04/11/16 0443  Weight: 175 lb 7 oz (79.6 kg) 169 lb 6.4 oz (76.8 kg) 167 lb 14.4 oz (76.2 kg)    Telemetry    Sinus bradycardia - Personally Reviewed  ECG    Sinus bradycardia - Personally Reviewed  Physical Exam   GEN: No acute distress.   Neck: 7 cm JVD Cardiac: Reg brady, no murmurs, rubs, or gallops.  Respiratory: Clear to auscultation bilaterally. GI: Soft, nontender, non-distended  MS: No edema;  No deformity. Neuro:  Nonfocal  Psych: Normal affect   Labs    Chemistry Recent Labs Lab 04/10/16 0828 04/11/16 0543  NA 135 135  K 4.8 4.3  CL 101 102  CO2 26 25  GLUCOSE 178* 94  BUN 40* 40*  CREATININE 3.13* 3.02*  CALCIUM 8.5* 8.5*  GFRNONAA 19* 19*  GFRAA 21* 22*  ANIONGAP 8 8     Hematology Recent Labs Lab 04/10/16 0828 04/11/16 0543  WBC 6.6 6.4  RBC 3.92* 3.71*  HGB 12.1* 11.2*  HCT 34.6* 32.5*  MCV 88.1 87.7  MCH 30.8 30.3  MCHC 35.0 34.5  RDW 14.7* 14.8*  PLT 236 210    Cardiac Enzymes Recent Labs Lab 04/10/16 0828  TROPONINI 0.05*   No results for input(s): TROPIPOC in the last 168 hours.   BNP Recent Labs Lab 04/10/16 0828  BNP 909.0*     DDimer No results for input(s): DDIMER in the last 168 hours.   Radiology    X-ray Chest Pa And Lateral  Result Date: 04/11/2016 CLINICAL DATA:  Acute on chronic congestive heart failure. EXAM: CHEST  2 VIEW COMPARISON:  PA and lateral chest 04/10/2016. FINDINGS: Pulmonary edema is markedly improved. Small bilateral pleural effusions are seen. Heart size is upper normal. Aortic atherosclerosis. Multiple leads overlie the patient. IMPRESSION: Marked improvement in pulmonary edema. Small bilateral pleural effusions. Electronically Signed   By: Inge Rise M.D.   On: 04/11/2016 09:09     Patient Profile          71 y.o. male with a prior history of severe hypertension, systolic heart failure, nonobstructive CAD, mixed ischemic and nonischemic cardiomyopathy myopathy, stage IV chronic kidney disease, sleep apnea, and tobacco abuse, who was recently discharged following heart failure admission, and presented to the ED February 23rd with recurrent dyspnea and weight gain.    Assessment & Plan     1  Acute on chronic systolic CHF - he c/o being a little sob but there was concern about too much diuresis and diuretics held. Will follow.  2  HTN urgency  - his blood pressure is higher with rebound  of clonidine. I will/have adjusted his other meds.  3  CKD  Stage 4  4  CAD  Mod dz on recent cath  5  Sinus node dysfunciton  - I would suggest observing for now. He may ultimately require PPM but lets let the clonidine wash out.  Signed, Cristopher Peru, MD  04/12/2016, 9:19 AM

## 2016-04-13 DIAGNOSIS — I5023 Acute on chronic systolic (congestive) heart failure: Secondary | ICD-10-CM

## 2016-04-13 LAB — BASIC METABOLIC PANEL
ANION GAP: 8 (ref 5–15)
BUN: 44 mg/dL — ABNORMAL HIGH (ref 6–20)
CALCIUM: 8.3 mg/dL — AB (ref 8.9–10.3)
CO2: 26 mmol/L (ref 22–32)
CREATININE: 3.47 mg/dL — AB (ref 0.61–1.24)
Chloride: 100 mmol/L — ABNORMAL LOW (ref 101–111)
GFR, EST AFRICAN AMERICAN: 19 mL/min — AB (ref >60)
GFR, EST NON AFRICAN AMERICAN: 16 mL/min — AB (ref >60)
Glucose, Bld: 86 mg/dL (ref 65–99)
Potassium: 4.2 mmol/L (ref 3.5–5.1)
Sodium: 134 mmol/L — ABNORMAL LOW (ref 135–145)

## 2016-04-13 LAB — GLUCOSE, CAPILLARY
GLUCOSE-CAPILLARY: 120 mg/dL — AB (ref 65–99)
GLUCOSE-CAPILLARY: 134 mg/dL — AB (ref 65–99)
GLUCOSE-CAPILLARY: 102 mg/dL — AB (ref 65–99)
Glucose-Capillary: 83 mg/dL (ref 65–99)

## 2016-04-13 MED ORDER — NITROGLYCERIN 2 % TD OINT
0.5000 [in_us] | TOPICAL_OINTMENT | Freq: Four times a day (QID) | TRANSDERMAL | Status: DC
  Administered 2016-04-13: 0.5 [in_us] via TOPICAL
  Filled 2016-04-13: qty 1

## 2016-04-13 MED ORDER — HYDRALAZINE HCL 50 MG PO TABS
100.0000 mg | ORAL_TABLET | Freq: Four times a day (QID) | ORAL | Status: DC
  Administered 2016-04-13 – 2016-04-14 (×4): 100 mg via ORAL
  Filled 2016-04-13 (×4): qty 2

## 2016-04-13 MED ORDER — ISOSORBIDE MONONITRATE ER 60 MG PO TB24
120.0000 mg | ORAL_TABLET | Freq: Every day | ORAL | Status: DC
  Administered 2016-04-14: 120 mg via ORAL
  Filled 2016-04-13: qty 2

## 2016-04-13 MED ORDER — ISOSORBIDE MONONITRATE ER 60 MG PO TB24
60.0000 mg | ORAL_TABLET | Freq: Two times a day (BID) | ORAL | Status: DC
Start: 2016-04-13 — End: 2016-04-13

## 2016-04-13 MED ORDER — ISOSORBIDE MONONITRATE ER 60 MG PO TB24
60.0000 mg | ORAL_TABLET | Freq: Every day | ORAL | Status: DC
  Administered 2016-04-13: 60 mg via ORAL
  Filled 2016-04-13: qty 1

## 2016-04-13 MED ORDER — ISOSORBIDE MONONITRATE ER 60 MG PO TB24
60.0000 mg | ORAL_TABLET | Freq: Once | ORAL | Status: AC
  Administered 2016-04-13: 60 mg via ORAL
  Filled 2016-04-13: qty 1

## 2016-04-13 MED ORDER — FUROSEMIDE 20 MG PO TABS
20.0000 mg | ORAL_TABLET | Freq: Every day | ORAL | Status: DC
  Administered 2016-04-13 – 2016-04-14 (×2): 20 mg via ORAL
  Filled 2016-04-13 (×2): qty 1

## 2016-04-13 NOTE — Progress Notes (Signed)
Physical Therapy Treatment Patient Details Name: Colton Thompson MRN: [id number] DOB: 05-17-1945 Today's Date: 04/13/2016    History of Present Illness Pt is a 71 yo male, admitted to [facility] increaed SOB and weight gain, diagnosed w/ CHF and x-ray positive for pneumonia. Was also admitted on 02/10 for SOB and was placed on Bipap. PMH includes; chronic combined systolic and diastolic heart failure, CKD stage 4, HTN, type II DM, and sleep apnea    PT Comments    Pt is able to ambulate two laps around RN station without difficulty. VSS throughout ambulation. Pt able to ambulate without an assistive device but is more stable with a single point cane in LUE. He demonstrates mild to moderate lateral gait deviation with horizontal and vertical head turns but is able to maintain balance. He is able to complete all standing exercises but demonstrates impairment in single leg balance. Pt will benefit from skilled PT services to address deficits in strength, balance, and mobility in order to return to full function at home.     Follow Up Recommendations  Home health PT;Other (comment) (For balance and strengthening)     Equipment Recommendations  None recommended by PT    Recommendations for Other Services       Precautions / Restrictions Precautions Precautions: None Restrictions Weight Bearing Restrictions: Yes RUE Weight Bearing: Non weight bearing Other Position/Activity Restrictions: NWBing R wrist due to cardiac cath on 04/03/16    Mobility  Bed Mobility Overal bed mobility: Independent             General bed mobility comments: Good speed/sequencing  Transfers Overall transfer level: Independent Equipment used: None             General transfer comment: Good speed, sequencing, and stability with transfers  Ambulation/Gait Ambulation/Gait assistance: Supervision Ambulation Distance (Feet): 400 Feet Assistive device: Straight cane Gait Pattern/deviations: WFL(Within  Functional Limits) Gait velocity: WFL for community mobility Gait velocity interpretation: >2.62 ft/sec, indicative of independent community ambulator General Gait Details: Pt ambualted with spc in LUE 2 laps around RN station. Pt able to perform horizontal and vertical head turns but with mild to moderate lateral gait deviation. Able to self-correct without loss of balance. VSS throughout ambulation and pt denies DOE. Able to change gait speed on command   Stairs            Wheelchair Mobility    Modified Rankin (Stroke Patients Only)       Balance Overall balance assessment: Needs assistance Sitting-balance support: No upper extremity supported;Feet supported Sitting balance-Leahy Scale: Normal     Standing balance support: No upper extremity supported Standing balance-Leahy Scale: Fair Standing balance comment: Able to maintain wide and narrow stance. Positive Rhomberg                    Cognition Arousal/Alertness: Awake/alert Behavior During Therapy: WFL for tasks assessed/performed Overall Cognitive Status: Within Functional Limits for tasks assessed                      Exercises Other Exercises Other Exercises: Standing marches without UE support x 10 bilateral, standing mini squats x 10;    General Comments        Pertinent Vitals/Pain Pain Assessment: No/denies pain    Home Living                      Prior Function  PT Goals (current goals can now be found in the care plan section) Acute Rehab PT Goals Patient Stated Goal: Return home PT Goal Formulation: With patient/family Time For Goal Achievement: 04/24/16 Potential to Achieve Goals: Good Progress towards PT goals: Progressing toward goals    Frequency    Min 2X/week      PT Plan Current plan remains appropriate    Co-evaluation             End of Session Equipment Utilized During Treatment: Gait belt Activity Tolerance: Patient tolerated  treatment well Patient left: in bed;with family/visitor present   PT Visit Diagnosis: Other abnormalities of gait and mobility (R26.89)     Time: 3664-4034 PT Time Calculation (min) (ACUTE ONLY): 13 min  Charges:  $Therapeutic Exercise: 8-22 mins                    G Codes:      Lyndel Safe Briseida Gittings PT, DPT   Nimrit Kehres 04/13/2016, 1:14 PM

## 2016-04-13 NOTE — Progress Notes (Signed)
Dr. Verdell Carmine paged to make aware of elevated blood pressure / pt asymptomatic.

## 2016-04-13 NOTE — Progress Notes (Signed)
Morning BP elevated. Medicated with IV Hydralazine. Repeat BP still elevated. MD notified. MD to review patient information.

## 2016-04-13 NOTE — Progress Notes (Signed)
SATURATION QUALIFICATIONS: (This note is used to comply with regulatory documentation for home oxygen)  Patient Saturations on Room Air at Rest = 91%  Patient Saturations on Room Air while Ambulating = 86%  Patient Saturations on 2 Liters of oxygen while Ambulating = 95%  Please briefly explain why patient needs home oxygen:

## 2016-04-13 NOTE — Progress Notes (Signed)
Patient Name: Colton Thompson Date of Encounter: 04/13/2016  Primary Cardiologist: [facility] Problem List     Active Problems:   CHF (congestive heart failure) ([place])   Malignant hypertension   End stage renal disease (H[facility])     Subjective   Less SOB this morning. Remains on oxygen via nasal cannula at 2 L. Has had some trials of room air. BP remains elevated off clonidine and beta blocker, currently 062'I systolic s/p IV hydralazine at 10 AM. No chest pain or palpitations. No dizziness or presyncope.   Inpatient Medications    Scheduled Meds: . amLODipine  10 mg Oral Daily  . aspirin EC  81 mg Oral Daily  . enoxaparin (LOVENOX) injection  30 mg Subcutaneous Q24H  . hydrALAZINE  100 mg Oral TID  . insulin aspart  0-5 Units Subcutaneous QHS  . insulin aspart  0-9 Units Subcutaneous TID WC  . insulin glargine  15 Units Subcutaneous QHS  . levofloxacin  750 mg Oral Q48H  . nitroGLYCERIN  0.5 inch Topical Q6H  . pantoprazole  40 mg Oral Daily  . pravastatin  40 mg Oral q1800  . sodium chloride flush  3 mL Intravenous Q12H  . sodium chloride flush  3 mL Intravenous Q12H  . tamsulosin  0.4 mg Oral Daily  . vitamin B-12  1,000 mcg Oral Daily   Continuous Infusions:  PRN Meds: sodium chloride, acetaminophen **OR** acetaminophen, albuterol, hydrALAZINE, LORazepam, morphine CONCENTRATE, ondansetron **OR** ondansetron (ZOFRAN) IV, senna-docusate, traMADol   Vital Signs    Vitals:   04/13/16 0643 04/13/16 0833 04/13/16 0944 04/13/16 1013  BP: (!) 177/61 (!) 193/56 (!) 187/62 (!) 161/55  Pulse:  61    Resp:  18    Temp:      TempSrc:      SpO2:  95% 91%   Weight:      Height:        Intake/Output Summary (Last 24 hours) at 04/13/16 1102 Last data filed at 04/13/16 0950  Gross per 24 hour  Intake              960 ml  Output              370 ml  Net              590 ml   Filed Weights   04/10/16 1703 04/11/16 0443 04/13/16 0500  Weight: 169 lb 6.4 oz (76.8  kg) 167 lb 14.4 oz (76.2 kg) 163 lb 8 oz (74.2 kg)    Physical Exam    GEN: Well nourished, well developed, in no acute distress.  HEENT: Grossly normal.  Neck: Supple, JVD ~ 6 cm, no carotid bruits, or masses. Cardiac: Irregular, II/VI systolic murmur, no rubs, or gallops. No clubbing, cyanosis, edema.  Radials/DP/PT 2+ and equal bilaterally.  Respiratory:  Respirations regular and unlabored, clear to auscultation bilaterally. GI: Soft, nontender, nondistended, BS + x 4. MS: no deformity or atrophy. Skin: warm and dry, no rash. Neuro:  Strength and sensation are intact. Psych: AAOx3.  Normal affect.  Labs    CBC  Recent Labs  04/11/16 0543  WBC 6.4  HGB 11.2*  HCT 32.5*  MCV 87.7  PLT 948   Basic Metabolic Panel  Recent Labs  04/11/16 0543 04/13/16 0624  NA 135 134*  K 4.3 4.2  CL 102 100*  CO2 25 26  GLUCOSE 94 86  BUN 40* 44*  CREATININE 3.02* 3.47*  CALCIUM  8.5* 8.3*   Liver Function Tests No results for input(s): AST, ALT, ALKPHOS, BILITOT, PROT, ALBUMIN in the last 72 hours. No results for input(s): LIPASE, AMYLASE in the last 72 hours. Cardiac Enzymes No results for input(s): CKTOTAL, CKMB, CKMBINDEX, TROPONINI in the last 72 hours. BNP Invalid input(s): POCBNP D-Dimer No results for input(s): DDIMER in the last 72 hours. Hemoglobin A1C No results for input(s): HGBA1C in the last 72 hours. Fasting Lipid Panel No results for input(s): CHOL, HDL, LDLCALC, TRIG, CHOLHDL, LDLDIRECT in the last 72 hours. Thyroid Function Tests No results for input(s): TSH, T4TOTAL, T3FREE, THYROIDAB in the last 72 hours.  Invalid input(s): FREET3  Telemetry    Sinus rhythm, 60's bpm currently, frequent PVCs/PACs - Personally Reviewed  ECG    n/a - Personally Reviewed  Radiology    No results found.  Cardiac Studies   [facility] 04/03/16: Conclusion     Ost Cx to Prox Cx lesion, 70 %stenosed.  Prox RCA lesion, 60 %stenosed.  Mid RCA to Dist RCA lesion,  50 %stenosed.  Prox LAD lesion, 60 %stenosed.  Mid LAD lesion, 60 %stenosed.  Dist LAD lesion, 60 %stenosed.   1. Moderate heavily calcified three-vessel coronary artery disease. No evidence of obstructive disease to explain the patient's cardiomyopathy. 2. Left ventricular angiography was not performed due to chronic kidney disease. EF is severely reduced by echo. 3. Right heart catheterization showed mildly elevated filling pressures, mild pulmonary hypertension and normal cardiac output. RA pressure: 7 mmHg, RV pressure: 48 over 2 mmHg, pulmonary capillary wedge pressure: 18 mmHg, PA pressure: 45/16 with a mean of 28 mmHg. LVEDP was 17 mmHg. Cardiac output was 5.49 with a cardiac index of 2.79.  Recommendations: Recommend medical therapy for coronary artery disease. Continue to optimize heart failure. The patient's volume status appears reasonable. I resumed furosemide 40 mg by mouth twice daily. He might require higher doses due to underlying chronic kidney disease. Monitor renal function closely. Only 30 mL of contrast was used.    TTE 03/28/16: Study Conclusions  - Left ventricle: The cavity size was moderately dilated. Systolic   function was severely reduced. The estimated ejection fraction   was in the range of 20% to 25%. Diffuse hypokinesis. Regional   wall motion abnormalities cannot be excluded. Findings consistent   with left ventricular diastolic dysfunction. - Mitral valve: There was mild to moderate regurgitation. - Left atrium: The atrium was mildly dilated. - Right ventricle: Systolic function was normal. - Tricuspid valve: There was mild-moderate regurgitation. - Pulmonary arteries: Systolic pressure was severely elevated. PA   peak pressure: 85 mm Hg (S).  Patient Profile     71 y.o. male with history of severe hypertension, systolic heart failure, nonobstructive CAD, mixed ischemic and nonischemic cardiomyopathy myopathy, stage IV chronic kidney disease,  sleep apnea, and tobacco abuse who was recently discharged from CHF admission returned to [facility] on 2/23 with recurrent dyspnea and weight gain.   Assessment & Plan    1. Acute on chronic CHF: -Breathing improved -Remains on nasal cannula, wean wean as able -Diuresis held given worsening renal function -Restart PO Lasix as renal function improves -Not on beta blocker at this time given bradycardia, restart if able -Not on spironolactone, ACEi/ARB 2/2 AKI on CKD  2. Malignant HTN: -BP slowly improving with Norvasc, hydralazine, nitro paste and IV hydralazine prn -Clonidine held 2/24, though no worsening of BP with holding of clonidine as he presented with SBP in the 180-190 range on clonidine -Perhaps  there is some rebound given persistent elevations on the above regimen -Consider renal artery ultrasound -Cannot add spiro/ACE/ARB as above 2/2 renal function -Not on beta blocker 2/2 bradycardia -Continue current medications  3. Acute on CKD stage IV: -Diuresis held -Monitor renal function  4. Sinus node dysfunction: -Currently with heart rate in the 60's, sinus rhythm -No acute indication for PPM at this time  5. History of VT: -None seen on tele -Beta blocker and amiodarone have been held 2/2 bradycardia -Restart low-dose when able  Signed, Christell Faith, PA-C [place] Pager: [phone] 04/13/2016, 11:02 AM

## 2016-04-13 NOTE — Progress Notes (Signed)
[facility] [place] NAME: Colton Thompson    MR#:  [id number]  DATE OF BIRTH:  1946-02-12  SUBJECTIVE:   Patient here due to shortness of breath secondary to CHF.  Bradycardia has significantly improved, blood pressures significantly elevated still. Clinically asymptomatic though.  REVIEW OF SYSTEMS:    Review of Systems  Constitutional: Negative for chills and fever.  HENT: Negative for congestion and tinnitus.   Eyes: Negative for blurred vision and double vision.  Respiratory: Negative for cough, shortness of breath and wheezing.   Cardiovascular: Negative for chest pain, orthopnea and PND.  Gastrointestinal: Negative for abdominal pain, diarrhea, nausea and vomiting.  Genitourinary: Negative for dysuria and hematuria.  Neurological: Negative for dizziness, sensory change and focal weakness.  All other systems reviewed and are negative.   Nutrition: heart healthy/Carb control Tolerating Diet: Yes Tolerating PT: Evaluation noted  DRUG ALLERGIES:  No Known Allergies  VITALS:  Blood pressure (!) 196/63, pulse (!) 59, temperature 98.4 F (36.9 C), temperature source Oral, resp. rate 20, height 6' (1.829 m), weight 74.2 kg (163 lb 8 oz), SpO2 98 %.  PHYSICAL EXAMINATION:   Physical Exam  GENERAL:  71 y.o.-year-old patient lying in bed in no acute distress.  EYES: Pupils equal, round, reactive to light and accommodation. No scleral icterus. Extraocular muscles intact.  HEENT: Head atraumatic, normocephalic. Oropharynx and nasopharynx clear.  NECK:  Supple, no jugular venous distention. No thyroid enlargement, no tenderness.  LUNGS: Normal breath sounds bilaterally, no wheezing, rales, rhonchi. No use of accessory muscles of respiration.  CARDIOVASCULAR: S1, S2 Bradycardic. No murmurs, rubs, or gallops.  ABDOMEN: Soft, nontender, nondistended. Bowel sounds present. No organomegaly or mass.  EXTREMITIES: No cyanosis, clubbing, + 1  edema b/l NEUROLOGIC: Cranial nerves II through XII are intact. No focal Motor or sensory deficits b/l.   PSYCHIATRIC: The patient is alert and oriented x 3.  SKIN: No obvious rash, lesion, or ulcer.    LABORATORY PANEL:   CBC  Recent Labs Lab 04/11/16 0543  WBC 6.4  HGB 11.2*  HCT 32.5*  PLT 210   ------------------------------------------------------------------------------------------------------------------  Chemistries   Recent Labs Lab 04/13/16 0624  NA 134*  K 4.2  CL 100*  CO2 26  GLUCOSE 86  BUN 44*  CREATININE 3.47*  CALCIUM 8.3*   ------------------------------------------------------------------------------------------------------------------  Cardiac Enzymes  Recent Labs Lab 04/10/16 0828  TROPONINI 0.05*   ------------------------------------------------------------------------------------------------------------------  RADIOLOGY:  No results found.   ASSESSMENT AND PLAN:   71 year old male with past medical history of non-ischemic cardiomyopathy ejection fraction of 20-30%, history of CHF, diabetes, kidney disease stage IV, DM, HTN who presented to the hospital due to shortness of breath.   1. Acute on chronic systolic CHF- much improved w/ Diuresis - about 2L (-) since admission and will resume home dose Lasix today.  - clinically stable. NO B-blocker due to severe Bradycardia.   2. Shortness of breath-combination of underlying CHF and also air hunger from underlying COPD. -No evidence of acute COPD exacerbation. Cont. Roxanol for air hunger.   Improved.   3. Bradycardia - HR much improved and now in the 50's-60's.. - cont. Hold Coreg, Amio, Clonidine.  - appreciate Cards input and no plans for any intervention at this time.   4. Accelerated HTN - blood pressures are still quite labile.  - this is rebound hypertension from patient's clonidine being discontinued due to bradycardia. -Continue Norvasc, will advance hydralazine to 4 times  daily, increase the  dose of Imdur, continue as needed hydralazine. Follow hemodynamics.  5. Pneumonia - CAP. Incidentally noted on CXR on admission.  -Finished treatment with Levaquin. Pro-calcitonin level is normal.  6. CKD Stage IV - Cr. Close to baseline and will cont. To monitor.  - outpatient follow up with Nephrology (Dr. Candiss Norse)  7. Hyperlipidemia - cont. Pravachol.   8. GERD - cont. Protonix.   9. DM Type II w/ renal complications - cont. Lantus, SSI. BS stable.   D/c home once BP stable.    All the records are reviewed and case discussed with Care Management/Social Worker. Management plans discussed with the patient, family and they are in agreement.  CODE STATUS: Full Code  DVT Prophylaxis: Lovenox  TOTAL TIME TAKING CARE OF THIS PATIENT: 30 minutes.   POSSIBLE D/C IN 1-2 DAYS, DEPENDING ON CLINICAL CONDITION.   [name] M.D on 04/13/2016 at 3:04 PM  Between 7am to 6pm - Pager - [phone]  After 6pm go to [email or web address] - [employer] [place]  Office  [phone]  CC: Primary care physician; Marsh Dolly, MD

## 2016-04-14 ENCOUNTER — Encounter: Payer: Self-pay | Admitting: *Deleted

## 2016-04-14 LAB — GLUCOSE, CAPILLARY
GLUCOSE-CAPILLARY: 200 mg/dL — AB (ref 65–99)
Glucose-Capillary: 141 mg/dL — ABNORMAL HIGH (ref 65–99)
Glucose-Capillary: 168 mg/dL — ABNORMAL HIGH (ref 65–99)

## 2016-04-14 LAB — PROCALCITONIN: PROCALCITONIN: 0.11 ng/mL

## 2016-04-14 MED ORDER — AMLODIPINE BESYLATE 10 MG PO TABS: 10.0000 mg | ORAL_TABLET | Freq: Every day | ORAL | 1 refills | Status: AC

## 2016-04-14 MED ORDER — HYDRALAZINE HCL 100 MG PO TABS: 100.0000 mg | ORAL_TABLET | Freq: Four times a day (QID) | ORAL | 0 refills | Status: AC

## 2016-04-14 MED ORDER — CLONIDINE HCL 0.1 MG PO TABS
0.1000 mg | ORAL_TABLET | Freq: Two times a day (BID) | ORAL | Status: DC
  Administered 2016-04-14: 0.1 mg via ORAL

## 2016-04-14 MED ORDER — ISOSORBIDE MONONITRATE ER 120 MG PO TB24: 120.0000 mg | ORAL_TABLET | Freq: Every day | ORAL | 1 refills | Status: AC

## 2016-04-14 NOTE — Clinical Social Work Note (Signed)
CSW received referral for SNF.  Case discussed with case manager and plan is to discharge home with home health.  CSW to sign off please re-consult if social work needs arise.  Merion Grimaldo R. Corlene Sabia, MSW, LCSWA [phone]  

## 2016-04-14 NOTE — Progress Notes (Signed)
Patient Name: Colton Thompson Date of Encounter: 04/14/2016  Primary Cardiologist: [facility], [facility] Problem [facility]     Active Problems:   CHF (congestive heart failure) ([place])   Malignant hypertension   End stage renal disease (HCC)     Subjective   SOB continues to improve. No chest pain. Remains in oxygen via nasal cannula at 2 L. BP improved this morning into the 185 systolic from 631 systolic previously. Heart rate well controlled in the 60's bpm. No dizziness or presyncope.   Inpatient Medications    Scheduled Meds: . amLODipine  10 mg Oral Daily  . aspirin EC  81 mg Oral Daily  . cloNIDine  0.1 mg Oral BID  . enoxaparin (LOVENOX) injection  30 mg Subcutaneous Q24H  . furosemide  20 mg Oral Daily  . hydrALAZINE  100 mg Oral QID  . insulin aspart  0-5 Units Subcutaneous QHS  . insulin aspart  0-9 Units Subcutaneous TID WC  . insulin glargine  15 Units Subcutaneous QHS  . isosorbide mononitrate  120 mg Oral Daily  . pantoprazole  40 mg Oral Daily  . pravastatin  40 mg Oral q1800  . sodium chloride flush  3 mL Intravenous Q12H  . sodium chloride flush  3 mL Intravenous Q12H  . tamsulosin  0.4 mg Oral Daily  . vitamin B-12  1,000 mcg Oral Daily   Continuous Infusions:  PRN Meds: sodium chloride, acetaminophen **OR** acetaminophen, albuterol, hydrALAZINE, LORazepam, morphine CONCENTRATE, ondansetron **OR** ondansetron (ZOFRAN) IV, senna-docusate, traMADol   Vital Signs    Vitals:   04/13/16 1419 04/13/16 1512 04/13/16 2006 04/14/16 0511  BP: (!) 196/63 (!) 166/59 (!) 157/58 (!) 167/65  Pulse:   (!) 59 68  Resp:   14 16  Temp:   98.7 F (37.1 C) 98.5 F (36.9 C)  TempSrc:   Oral Oral  SpO2:   96% 97%  Weight:    162 lb 12.6 oz (73.8 kg)  Height:        Intake/Output Summary (Last 24 hours) at 04/14/16 1016 Last data filed at 04/14/16 0514  Gross per 24 hour  Intake              123 ml  Output             1485 ml  Net            -1362 ml   Filed  Weights   04/11/16 0443 04/13/16 0500 04/14/16 0511  Weight: 167 lb 14.4 oz (76.2 kg) 163 lb 8 oz (74.2 kg) 162 lb 12.6 oz (73.8 kg)    Physical Exam    GEN: Well nourished, well developed, in no acute distress.  HEENT: Grossly normal.  Neck: Supple, JVD ~ 6 cm, no carotid bruits, or masses. Cardiac: Irregular, II/VI systolic murmur, no rubs, or gallops. No clubbing, cyanosis, edema.  Radials/DP/PT 2+ and equal bilaterally.  Respiratory:  Respirations regular and unlabored, clear to auscultation bilaterally. GI: Soft, nontender, nondistended, BS + x 4. MS: no deformity or atrophy. Skin: warm and dry, no rash. Neuro:  Strength and sensation are intact. Psych: AAOx3.  Normal affect.  Labs    CBC No results for input(s): WBC, NEUTROABS, HGB, HCT, MCV, PLT in the last 72 hours. Basic Metabolic Panel  Recent Labs  04/13/16 0624  NA 134*  K 4.2  CL 100*  CO2 26  GLUCOSE 86  BUN 44*  CREATININE 3.47*  CALCIUM 8.3*   Liver Function  Tests No results for input(s): AST, ALT, ALKPHOS, BILITOT, PROT, ALBUMIN in the last 72 hours. No results for input(s): LIPASE, AMYLASE in the last 72 hours. Cardiac Enzymes No results for input(s): CKTOTAL, CKMB, CKMBINDEX, TROPONINI in the last 72 hours. BNP Invalid input(s): POCBNP D-Dimer No results for input(s): DDIMER in the last 72 hours. Hemoglobin A1C No results for input(s): HGBA1C in the last 72 hours. Fasting Lipid Panel No results for input(s): CHOL, HDL, LDLCALC, TRIG, CHOLHDL, LDLDIRECT in the last 72 hours. Thyroid Function Tests No results for input(s): TSH, T4TOTAL, T3FREE, THYROIDAB in the last 72 hours.  Invalid input(s): FREET3  Telemetry    Sinus rhythm, 60's bpm, frequent PACs - Personally Reviewed  ECG    n/a - Personally Reviewed  Radiology    No results found.  Cardiac Studies   [facility] 04/03/16: Conclusion     Ost Cx to Prox Cx lesion, 70 %stenosed.  Prox RCA lesion, 60 %stenosed.  Mid RCA to  Dist RCA lesion, 50 %stenosed.  Prox LAD lesion, 60 %stenosed.  Mid LAD lesion, 60 %stenosed.  Dist LAD lesion, 60 %stenosed.  1. Moderate heavily calcified three-vessel coronary artery disease. No evidence of obstructive disease to explain the patient's cardiomyopathy. 2. Left ventricular angiography was not performed due to chronic kidney disease. EF is severely reduced by echo. 3. Right heart catheterization showed mildly elevated filling pressures, mild pulmonary hypertension and normal cardiac output. RA pressure: 7 mmHg, RV pressure: 48 over 2 mmHg, pulmonary capillary wedge pressure: 18 mmHg, PA pressure: 45/16 with a mean of 28 mmHg. LVEDP was 17 mmHg. Cardiac output was 5.49 with a cardiac index of 2.79.  Recommendations: Recommend medical therapy for coronary artery disease. Continue to optimize heart failure. The patient's volume status appears reasonable. I resumed furosemide 40 mg by mouth twice daily. He might require higher doses due to underlying chronic kidney disease. Monitor renal function closely. Only 30 mL of contrast was used.    TTE 03/28/16: Study Conclusions  - Left ventricle: The cavity size was moderately dilated. Systolic function was severely reduced. The estimated ejection fraction was in the range of 20% to 25%. Diffuse hypokinesis. Regional wall motion abnormalities cannot be excluded. Findings consistent with left ventricular diastolic dysfunction. - Mitral valve: There was mild to moderate regurgitation. - Left atrium: The atrium was mildly dilated. - Right ventricle: Systolic function was normal. - Tricuspid valve: There was mild-moderate regurgitation. - Pulmonary arteries: Systolic pressure was severely elevated. PA peak pressure: 85 mm Hg (S).   Patient Profile     71 y.o. male with history of severe hypertension, systolic heart failure, nonobstructive CAD, mixed ischemic and nonischemic cardiomyopathy myopathy, stage IV chronic  kidney disease, sleep apnea, and tobacco abuse who was recently discharged from CHF admission returned to [facility] on 2/23 with recurrent dyspnea and weight gain.   Assessment & Plan    1. Acute on chronic systolic CHF/NICM: -Breathing improved -Remains on nasal cannula, wean wean as able -Will need PO Lasix 40 mg bid -Restart PO Lasix as renal function improves (no renal function today) -Not on beta blocker at this time given bradycardia, restart if able -Not on spironolactone, ACEi/ARB 2/2 AKI on CKD  2. Malignant HTN: -BP slowly improving with Norvasc, hydralazine, nitro paste and IV hydralazine prn -Clonidine held 2/24, though no worsening of BP with holding of clonidine as he presented with SBP in the 180-190 range on clonidine -Perhaps there is some rebound given persistent elevations on the above regimen -  Restart clonidine 0.1 bid without beta blocker -Consider renal artery ultrasound -Cannot add spiro/ACE/ARB as above 2/2 renal function -Not on beta blocker 2/2 bradycardia -Continue remaining current medications  3. Acute on CKD stage IV: -Diuresis held -Monitor renal function  4. Sinus node dysfunction: -Currently with heart rate in the 60's, sinus rhythm -No acute indication for PPM at this time  5. History of VT: -None seen on tele -Beta blocker and amiodarone have been held 2/2 bradycardia -Restart low-dose when able  Signed, Christell Faith, PA-C [place] Pager: [phone] 04/14/2016, 10:16 AM

## 2016-04-14 NOTE — Care Management Important Message (Signed)
Important Message  Patient Details  Name: JAQUAVIOUS MERCER MRN: [id number] Date of Birth: 12-23-1945   Medicare Important Message Given:  Yes Initial signed IM printed from Epic and given to patient.    Katrina Stack, RN 04/14/2016, 12:02 PM

## 2016-04-14 NOTE — Discharge Summary (Signed)
[facility] NAME: Colton Thompson    MR#:  [id number]  DATE OF BIRTH:  [birth year]  DATE OF ADMISSION:  04/10/2016 ADMITTING PHYSICIAN: Bettey Costa, MD  DATE OF DISCHARGE: 04/14/2016  PRIMARY CARE PHYSICIAN: Marsh Dolly, MD    ADMISSION DIAGNOSIS:  Acute on chronic combined systolic and diastolic congestive heart failure (HCC) [I50.43] Severe hypertension [I10]  DISCHARGE DIAGNOSIS:  Active Problems:   CHF (congestive heart failure) (HCC)   Malignant hypertension   End stage renal disease ([place])   SECONDARY DIAGNOSIS:   Past Medical History:  Diagnosis Date  . Chronic systolic CHF (congestive heart failure) ([place])    a. 03/2016 Echo: EF 20-25%, diff HK, mild to mod MR, mildly dil LA, mild to mod TR, PASP 62mmHg.  . CKD (chronic kidney disease), stage IV ([place])   . DM (diabetes mellitus), type 2 ([place])   . HTN (hypertension)   . NICM (nonischemic cardiomyopathy) ([place])    a. 03/2016 Echo: Ef 20-25%.  . Non-obstructive CAD (coronary artery disease)    a. 03/2016 Cath: LM nl, LAD 60p, 35m, 60d, D1/2 min irregs, D3 nl, LCX 70ost/p, OM1/2/3 nl, RCA 60p, 17m/d, RPDA min irregs.  . Obstructive sleep apnea   . Tobacco abuse     HOSPITAL COURSE:   71 year old male with past medical history of non-ischemic cardiomyopathy ejection fraction of 20-30%, history of CHF, diabetes, kidney disease stage IV, DM, HTN who presented to the hospital due to shortness of breath.   1. Acute on chronic systolic CHF- Patient was diuresed with IV Lasix, and responded well to it and has clinically improved. Patient was seen by cardiology who agreed with the management. Patient is to liters negative since admission and will be discharged on his home dose of Lasix which is 40 mg twice daily. -he will follow-up with cardiology in the next week to 2 weeks.  2. Bradycardia - she became severely bradycardic with heart rates in the low 30s while in the hospital.  This was secondary to polypharmacy and sinus node dysfunction. Patient was taken off the clonidine, amiodarone, heart rate allow. His heart rates have significantly improved and currently on the 50s to 60s. He is hemodynamically stable. -There is no urgent need for any further intervention presently. Follow-up with cardiology as outpatient.  4. Accelerated HTN - blood pressures were quite labile during the hospital course. They have improved with adjustment of his antihypertensives. His accelerated hypertension was rebound hypertension as patient was taken off the clonidine due to his bradycardia. His clonidine has been reintroduced and his HR have remained stable on it.  - presently pt. Is being discharged on low dose Clonidine, Hydralazine, Norvasc, Imdur. .  5. Pneumonia - CAP. Incidentally noted on CXR on admission.  -Finished treatment with Levaquin. Pro-calcitonin level is normal.  6. CKD Stage IV - Cr. Remained Close to baseline and will cont. outpatient follow up with Nephrology (Dr. Candiss Norse)  7. Hyperlipidemia - he will cont. Pravachol.   8. GERD - he will cont. His Omeprazole.   9. DM Type II w/ renal complications - he will resume his lantus upon discharge.    DISCHARGE CONDITIONS:   Stable.   CONSULTS OBTAINED:  Treatment Team:  Minna Merritts, MD Wellington Hampshire, MD  DRUG ALLERGIES:  No Known Allergies  DISCHARGE MEDICATIONS:   Allergies as of 04/14/2016   No Known Allergies     Medication List    STOP taking these medications  amiodarone 200 MG tablet Commonly known as:  PACERONE   carvedilol 6.25 MG tablet Commonly known as:  COREG     TAKE these medications   albuterol 108 (90 Base) MCG/ACT inhaler Commonly known as:  PROVENTIL HFA;VENTOLIN HFA Inhale 1-2 puffs into the lungs every 6 (six) hours as needed for wheezing or shortness of breath.   amLODipine 10 MG tablet Commonly known as:  NORVASC Take 1 tablet (10 mg total) by mouth  daily. Start taking on:  04/15/2016   aspirin 81 MG EC tablet Take 1 tablet (81 mg total) by mouth daily.   cloNIDine 0.1 MG tablet Commonly known as:  CATAPRES Take 1 tablet (0.1 mg total) by mouth 2 (two) times daily.   furosemide 20 MG tablet Commonly known as:  LASIX Take 20 mg by mouth daily.   hydrALAZINE 100 MG tablet Commonly known as:  APRESOLINE Take 1 tablet (100 mg total) by mouth 4 (four) times daily. What changed:  when to take this   insulin glargine 100 UNIT/ML injection Commonly known as:  LANTUS Inject 15 Units into the skin at bedtime.   isosorbide mononitrate 120 MG 24 hr tablet Commonly known as:  IMDUR Take 1 tablet (120 mg total) by mouth daily. What changed:  medication strength  how much to take  when to take this   omeprazole 20 MG capsule Commonly known as:  PRILOSEC Take 20 mg by mouth 2 (two) times daily before a meal.   pravastatin 40 MG tablet Commonly known as:  PRAVACHOL Take 40 mg by mouth daily.   tamsulosin 0.4 MG Caps capsule Commonly known as:  FLOMAX Take 1 capsule (0.4 mg total) by mouth daily.   vitamin B-12 1000 MCG tablet Commonly known as:  CYANOCOBALAMIN Take 1,000 mcg by mouth daily.            Durable Medical Equipment        Start     Ordered   04/14/16 1124  DME Oxygen  Once    Question Answer Comment  Mode or (Route) Nasal cannula   Liters per Minute 2   Frequency Continuous (stationary and portable oxygen unit needed)   Oxygen conserving device Yes   Oxygen delivery system Gas      04/14/16 1124        DISCHARGE INSTRUCTIONS:   DIET:  Cardiac diet and Diabetic diet  DISCHARGE CONDITION:  Stable  ACTIVITY:  Activity as tolerated  OXYGEN:  Home Oxygen: Yes.     Oxygen Delivery: 2 liters/min via Patient connected to nasal cannula oxygen  DISCHARGE LOCATION:  Home with Home health nursing, PT.    If you experience worsening of your admission symptoms, develop shortness of breath,  life threatening emergency, suicidal or homicidal thoughts you must seek medical attention immediately by calling 911 or calling your MD immediately  if symptoms less severe.  You Must read complete instructions/literature along with all the possible adverse reactions/side effects for all the Medicines you take and that have been prescribed to you. Take any new Medicines after you have completely understood and accpet all the possible adverse reactions/side effects.   Please note  You were cared for by a hospitalist during your hospital stay. If you have any questions about your discharge medications or the care you received while you were in the hospital after you are discharged, you can call the unit and asked to speak with the hospitalist on call if the hospitalist that took care of you  is not available. Once you are discharged, your primary care physician will handle any further medical issues. Please note that NO REFILLS for any discharge medications will be authorized once you are discharged, as it is imperative that you return to your primary care physician (or establish a relationship with a primary care physician if you do not have one) for your aftercare needs so that they can reassess your need for medications and monitor your lab values.     Today   BP improved.  No chest pain, shortness of breath. Had some N/V this a.m. But improved.    VITAL SIGNS:  Blood pressure (!) 190/68, pulse 62, temperature 98 F (36.7 C), temperature source Oral, resp. rate 20, height 6' (1.829 m), weight 73.8 kg (162 lb 12.6 oz), SpO2 99 %.  I/O:   Intake/Output Summary (Last 24 hours) at 04/14/16 1521 Last data filed at 04/14/16 1415  Gross per 24 hour  Intake              603 ml  Output             1725 ml  Net            -1122 ml    PHYSICAL EXAMINATION:   GENERAL:  71 y.o.-year-old patient lying in bed in no acute distress.  EYES: Pupils equal, round, reactive to light and accommodation. No  scleral icterus. Extraocular muscles intact.  HEENT: Head atraumatic, normocephalic. Oropharynx and nasopharynx clear.  NECK:  Supple, no jugular venous distention. No thyroid enlargement, no tenderness.  LUNGS: Normal breath sounds bilaterally, no wheezing, rales, rhonchi. No use of accessory muscles of respiration.  CARDIOVASCULAR: S1, S2 RRR. No murmurs, rubs, or gallops.  ABDOMEN: Soft, nontender, nondistended. Bowel sounds present. No organomegaly or mass.  EXTREMITIES: No cyanosis, clubbing, + 1 edema b/l NEUROLOGIC: Cranial nerves II through XII are intact. No focal Motor or sensory deficits b/l.   PSYCHIATRIC: The patient is alert and oriented x 3.  SKIN: No obvious rash, lesion, or ulcer.    DATA REVIEW:   CBC  Recent Labs Lab 04/11/16 0543  WBC 6.4  HGB 11.2*  HCT 32.5*  PLT 210    Chemistries   Recent Labs Lab 04/13/16 0624  NA 134*  K 4.2  CL 100*  CO2 26  GLUCOSE 86  BUN 44*  CREATININE 3.47*  CALCIUM 8.3*    Cardiac Enzymes  Recent Labs Lab 04/10/16 0828  TROPONINI 0.05*    Microbiology Results  Results for orders placed or performed during the hospital encounter of 04/10/16  MRSA PCR Screening     Status: None   Collection Time: 04/10/16  8:02 AM  Result Value Ref Range Status   MRSA by PCR NEGATIVE NEGATIVE Final    Comment:        The GeneXpert MRSA Assay (FDA approved for NASAL specimens only), is one component of a comprehensive MRSA colonization surveillance program. It is not intended to diagnose MRSA infection nor to guide or monitor treatment for MRSA infections.     RADIOLOGY:  No results found.    Management plans discussed with the patient, family and they are in agreement.  CODE STATUS:     Code Status Orders        Start     Ordered   04/10/16 1430  Full code  Continuous     04/10/16 1429    Code Status History    Date Active Date Inactive Code Status Order  ID Comments User Context   03/28/2016  4:39 AM  04/04/2016  6:44 PM Full Code [id number]  Harrie Foreman, MD Inpatient      TOTAL TIME TAKING CARE OF THIS PATIENT: 40 minutes.    [name] M.D on 04/14/2016 at 3:21 PM  Between 7am to 6pm - Pager - [phone]  After 6pm go to [email or web address] - [employer] [facility]  Office  [phone]  CC: Primary care physician; Marsh Dolly, MD

## 2016-04-14 NOTE — Progress Notes (Signed)
Physical Therapy Treatment Patient Details Name: Colton Thompson MRN: [id number] DOB: [birth year] Today's Date: 04/14/2016    History of Present Illness Pt is a 71 yo male, admitted to [facility] increaed SOB and weight gain, diagnosed w/ CHF and x-ray positive for pneumonia. Was also admitted on 02/10 for SOB and was placed on Bipap. PMH includes; chronic combined systolic and diastolic heart failure, CKD stage 4, HTN, type II DM, and sleep apnea    PT Comments    Pt in bed, reports poor sleep last night.   Agrees to ambulation.  Bed mobility and transfers without assist.  Upon starting gait with SPC pt reaching for objects in room and staggering in hallway when rails were too far away.  Pt stated he felt unsteady today.  Rolling walker was in hallway and offered to pt.  Pt used walker for balance only. Gait significantly improved with walker but LE's continue to appear generally weak.  Pt made one lap around unit before stating he was fatigued and returned to room.  Long seated rest break given prior to attempting second lap.  Pt c/o continued fatigue and minor dizziness upon standing.  Minimal standing exercises as described below but pt self limited due to general fatigue.  Overall decreased gait quality and endurance today.  Pt stated he may have a walker at home but is unsure.     Follow Up Recommendations  Home health PT     Equipment Recommendations  Rolling walker with 5" wheels    Recommendations for Other Services       Precautions / Restrictions Precautions Precautions: Fall Restrictions Weight Bearing Restrictions: Yes Other Position/Activity Restrictions: NWBing R wrist due to cardiac cath on 04/03/16    Mobility  Bed Mobility Overal bed mobility: Independent                Transfers Overall transfer level: Independent Equipment used: None             General transfer comment: Good speed, sequencing, and stability with  transfers  Ambulation/Gait Ambulation/Gait assistance: Min guard Ambulation Distance (Feet): 200 Feet Assistive device: Rolling walker (2 wheeled) Gait Pattern/deviations: Step-through pattern   Gait velocity interpretation: Below normal speed for age/gender General Gait Details: More unsteady today with increased fatigue noted.   Stairs            Wheelchair Mobility    Modified Rankin (Stroke Patients Only)       Balance Overall balance assessment: Needs assistance Sitting-balance support: Feet supported Sitting balance-Leahy Scale: Normal     Standing balance support: Bilateral upper extremity supported Standing balance-Leahy Scale: Fair                      Cognition Arousal/Alertness: Awake/alert Behavior During Therapy: WFL for tasks assessed/performed Overall Cognitive Status: Within Functional Limits for tasks assessed                      Exercises Other Exercises Other Exercises: heel raises and marching in place x 10 before fatigue    General Comments        Pertinent Vitals/Pain Pain Assessment: No/denies pain    Home Living                      Prior Function            PT Goals (current goals can now be found in the care plan section)  Frequency    Min 2X/week      PT Plan Current plan remains appropriate    Co-evaluation             End of Session Equipment Utilized During Treatment: Gait belt Activity Tolerance: Patient tolerated treatment well Patient left: in chair;with chair alarm set;with call bell/phone within reach         Time: 0840-0858 PT Time Calculation (min) (ACUTE ONLY): 18 min  Charges:  $Gait Training: 8-22 mins                    G Codes:       Colton Thompson 05-11-2016, 9:40 AM

## 2016-04-14 NOTE — Care Management (Signed)
It does not appear that  the consent for transfer to the [facility] was faxed on admission.  faxed this information and updated information.  Patient has again qualified for home 02.  Sent qualifying sats to [facility] to be forwarded to the dept that sets up home 02 by Care coordinator- Lavella Lemons.

## 2016-04-14 NOTE — Plan of Care (Signed)
Problem: Education: Goal: Knowledge of disease or condition will improve Outcome: 16 Rn will review CHF teaching with patient and available family.   Problem: Respiratory: Goal: Respiratory status will improve Outcome: Progressing Pt qualifies for home oxygen. I will maintained oxygen as ordered.

## 2016-04-14 NOTE — Plan of Care (Signed)
Problem: Pain Managment: Goal: General experience of comfort will improve Outcome: Completed/Met Date Met: 04/14/16 Pt with no complaints of pain this shift  Problem: Physical Regulation: Goal: Diagnostic test results will improve Outcome: Completed/Met Date Met: 04/14/16 Pt completed antibiotics, WBC normal, CXR no longer shows PNA  Problem: Respiratory: Goal: Respiratory status will improve Outcome: Progressing Pt qualified for 2L O2 to go home with

## 2016-04-14 NOTE — Care Management (Signed)
Did not receive acknowledgement from [facility] about need for home 02 after leaving 2 messages and one fax.  Set home 02 up with  [facility] per request.  Notified [facility] of discharge and first home visit will be made within 24 hours of discharge.

## 2016-04-16 ENCOUNTER — Encounter: Payer: Self-pay | Admitting: Cardiovascular Disease

## 2016-04-16 ENCOUNTER — Ambulatory Visit (INDEPENDENT_AMBULATORY_CARE_PROVIDER_SITE_OTHER): Payer: Medicare Other | Admitting: Cardiovascular Disease

## 2016-04-16 VITALS — BP 138/70 | HR 57 | Ht 72.0 in | Wt 168.5 lb

## 2016-04-16 DIAGNOSIS — I420 Dilated cardiomyopathy: Secondary | ICD-10-CM

## 2016-04-16 DIAGNOSIS — Z720 Tobacco use: Secondary | ICD-10-CM

## 2016-04-16 DIAGNOSIS — N186 End stage renal disease: Secondary | ICD-10-CM | POA: Diagnosis not present

## 2016-04-16 DIAGNOSIS — I5022 Chronic systolic (congestive) heart failure: Secondary | ICD-10-CM

## 2016-04-16 DIAGNOSIS — I472 Ventricular tachycardia, unspecified: Secondary | ICD-10-CM

## 2016-04-16 DIAGNOSIS — I255 Ischemic cardiomyopathy: Secondary | ICD-10-CM | POA: Diagnosis not present

## 2016-04-16 DIAGNOSIS — I25118 Atherosclerotic heart disease of native coronary artery with other forms of angina pectoris: Secondary | ICD-10-CM | POA: Diagnosis not present

## 2016-04-16 DIAGNOSIS — I10 Essential (primary) hypertension: Secondary | ICD-10-CM | POA: Diagnosis not present

## 2016-04-16 MED ORDER — FUROSEMIDE 40 MG PO TABS: 40.0000 mg | ORAL_TABLET | Freq: Two times a day (BID) | ORAL | 6 refills | Status: DC

## 2016-04-16 NOTE — Progress Notes (Signed)
Cardiology Office Note  Date:  04/16/2016   ID:  Colton Thompson, DOB 10/11/1945, MRN [id number]  PCP:  Marsh Dolly, MD   Chief Complaint  Patient presents with  . other    New Patient. F/u cardiac cath tcm. Pt states he is doing well. Reviewed meds with pt verbally.    HPI:  71 year old male with a prior history of severe hypertension, systolic heart failure, nonobstructive 3 vessel CAD,  mixed ischemic and nonischemic cardiomyopathy myopathy, stage IV chronic kidney disease, sleep apnea, long history of tobacco abuse, 2 hospitalizations 03/28/2016  and 04/10/2016 for acute on chronic systolic CHF, weight gain, hypertension Who presents to establish care in the office in for follow-up of his acute on chronic systolic CHF Significant problems with urinary retention/prostate issues while in the hospital He is followed at the [facility]  Recent hospitalization with readmission to the hospital shortly after discharge for severe hypertension, shortness of breath Was discharged from his first hospital admission only on 20 mg Lasix daily hospital discharge 04/14/2016 Again was only discharged on Lasix 20 mg daily  Weight at home 166 pounds Blood pressure at home has been mildly elevated He is compliant with his clonidine, hydralazine, isosorbide, amlodipine He is not on ACE inhibitor or ARB given underlying renal failure Reports he is urinating better with Flomax He does not have follow-up appointment with nephrology  No regular exercise program, has been sleeping a lot Denies any significant ankle swelling, abdominal bloating. Some leg weakness which she attributes to recent long hospital course  Cardiac catheterization 04/03/2016 Results reviewed with him in detail  Ost Cx to Prox Cx lesion, 70 %stenosed.  Prox RCA lesion, 60 %stenosed.  Mid RCA to Dist RCA lesion, 50 %stenosed.  Prox LAD lesion, 60 %stenosed.  Mid LAD lesion, 60 %stenosed.  Dist LAD lesion, 60 %stenosed.    Moderate heavily calcified three-vessel coronary artery disease. No evidence of obstructive disease to explain the patient's cardiomyopathy.  EKG on today's visit shows normal sinus rhythm with rate 57 bpm, T-wave abnormality V3 through V6 consistent with lateral ischemia  PMH:   has a past medical history of Chronic systolic CHF (congestive heart failure) ([place]); CKD (chronic kidney disease), stage IV ([place]); DM (diabetes mellitus), type 2 ([place]); HTN (hypertension); NICM (nonischemic cardiomyopathy) ([place]); Non-obstructive CAD (coronary artery disease); Obstructive sleep apnea; Poor circulation (2009); and Tobacco abuse.  PSH:    Past Surgical History:  Procedure Laterality Date  . ABDOMINAL HERNIA REPAIR    . CARDIAC CATHETERIZATION    . RIGHT/LEFT HEART CATH AND CORONARY ANGIOGRAPHY N/A 04/03/2016   Procedure: Right/Left Heart Cath and Coronary Angiography;  Surgeon: Wellington Hampshire, MD;  Location: [facility];  Service: Cardiovascular;  Laterality: N/A;    Current Outpatient Prescriptions  Medication Sig Dispense Refill  . albuterol (PROVENTIL HFA;VENTOLIN HFA) 108 (90 Base) MCG/ACT inhaler Inhale 1-2 puffs into the lungs every 6 (six) hours as needed for wheezing or shortness of breath.    Marland Kitchen amLODipine (NORVASC) 10 MG tablet Take 1 tablet (10 mg total) by mouth daily. 30 tablet 1  . aspirin EC 81 MG EC tablet Take 1 tablet (81 mg total) by mouth daily. 30 tablet 0  . cloNIDine (CATAPRES) 0.1 MG tablet Take 1 tablet (0.1 mg total) by mouth 2 (two) times daily. 60 tablet 11  . furosemide (LASIX) 40 MG tablet Take 1 tablet (40 mg total) by mouth 2 (two) times daily. 60 tablet 6  . hydrALAZINE (APRESOLINE) 100 MG  tablet Take 1 tablet (100 mg total) by mouth 4 (four) times daily. 120 tablet 0  . insulin glargine (LANTUS) 100 UNIT/ML injection Inject 15 Units into the skin at bedtime.    . isosorbide mononitrate (IMDUR) 120 MG 24 hr tablet Take 1 tablet (120 mg total) by mouth daily. 30  tablet 1  . omeprazole (PRILOSEC) 20 MG capsule Take 20 mg by mouth 2 (two) times daily before a meal.    . pravastatin (PRAVACHOL) 40 MG tablet Take 40 mg by mouth daily.    . tamsulosin (FLOMAX) 0.4 MG CAPS capsule Take 1 capsule (0.4 mg total) by mouth daily. 30 capsule 0  . vitamin B-12 (CYANOCOBALAMIN) 1000 MCG tablet Take 1,000 mcg by mouth daily.     No current facility-administered medications for this visit.      Allergies:   Patient has no known allergies.   Social History:  The patient  reports that he quit smoking about 3 weeks ago. His smoking use included Cigarettes. He started smoking about 48 years ago. He has a 25.00 pack-year smoking history. He has never used smokeless tobacco. He reports that he does not drink alcohol or use drugs.   Family History:   family history includes Cancer in his brother; Heart attack in his mother; Hypertension in his father; Stroke in his brother, brother, father, mother, and sister.    Review of Systems: Review of Systems  Constitutional: Positive for malaise/fatigue.  Respiratory: Negative.   Cardiovascular: Negative.   Gastrointestinal: Negative.   Musculoskeletal: Negative.        Gait instability gait instability  Neurological: Positive for weakness.  Psychiatric/Behavioral: Negative.   All other systems reviewed and are negative.    PHYSICAL EXAM: VS:  BP 138/70 (BP Location: Right Arm, Patient Position: Sitting, Cuff Size: Normal)   Pulse (!) 57   Ht 6' (1.829 m)   Wt 168 lb 8 oz (76.4 kg)   BMI 22.85 kg/m  , BMI Body mass index is 22.85 kg/m. GEN: Well nourished, well developed, in no acute distress  HEENT: normal  Neck:  JVD 12+, no carotid bruits, or masses Cardiac: RRR; no murmurs, rubs, or gallops,no edema  Respiratory:  clear to auscultation bilaterally, normal work of breathing GI: soft, nontender, nondistended, + BS MS: no deformity or atrophy  Skin: warm and dry, no rash Neuro:  Strength and sensation are  intactin Psych: euthymic mood, full affect    Recent Labs: 03/28/2016: ALT 12; TSH 2.607 03/31/2016: Magnesium 1.8 04/10/2016: B Natriuretic Peptide 909.0 04/11/2016: Hemoglobin 11.2; Platelets 210 04/13/2016: BUN 44; Creatinine, Ser 3.47; Potassium 4.2; Sodium 134    Lipid Panel Lab Results  Component Value Date   CHOL 128 04/04/2016   HDL 45 04/04/2016   LDLCALC 70 04/04/2016   TRIG 65 04/04/2016      Wt Readings from Last 3 Encounters:  04/16/16 168 lb 8 oz (76.4 kg)  04/14/16 162 lb 12.6 oz (73.8 kg)  04/08/16 171 lb 4 oz (77.7 kg)       ASSESSMENT AND PLAN:  Chronic systolic CHF (congestive heart failure) ([place]) - Plan: EKG 12-Lead He was discharged recently on Lasix 20 mg daily on both of his hospital visits We had previously recommended Lasix 40 mg twice a day New prescription has been sent in Long discussion concerning monitoring his weight. We recommended he call our office for weight of 170 pounds, increase Lasix up to 60 mg twice a day, consider adding metolazone as needed  Essential hypertension - Plan: EKG 12-Lead Blood pressures mildly elevated at home, well controlled on today's visit 209 systolic on my recheck. Recommended that he take extra clonidine as needed for hypertension If blood pressure runs high at home on a consistent basis he may need clonidine 0.2 mg twice a day  Dilated cardiomyopathy ([place]) - Plan: EKG 12-Lead Ejection fraction 20-25% Likely nonischemic as well as ischemic component Repeat echocardiogram in several months time  Tobacco abuse - Plan: EKG 12-Lead We have encouraged him to continue to work on weaning his cigarettes and smoking cessation. He will continue to work on this and does not want any assistance with chantix.   Coronary artery disease of native artery of native heart with stable angina pectoris (HCC) 3 vessel coronary disease, stressed importance of aggressive lipid control, no smoking  Ischemic cardiomyopathy Likely  component of nonischemic and ischemic cardiomyopathy Suspect long-standing hypertensive heart disease with heart failure Recommended he continue to monitor blood pressure  VT (ventricular tachycardia) (HCC) 30 seconds of sustained VT noted in the hospital first hospital admission Previously on amiodarone and beta blocker This was held for bradycardia Rate today still in the 50s, will not add any medication  End stage renal disease (HCC) Creatinine 3.5, followed by nephrology We have recommended he make an appointment Also would discuss with the [facility]   Total encounter time more than 45 minutes  Greater than 50% was spent in counseling and coordination of care with the patient  He has follow-up in CHF clinic 1 month Disposition:   F/U  2 months   Orders Placed This Encounter  Procedures  . EKG 12-Lead     Signed, Esmond Plants, M.D., Ph.D. 04/16/2016  [place], [place]

## 2016-04-16 NOTE — Patient Instructions (Addendum)
Medication Instructions:   Please increase the lasix up to 40 mg twice a day If the weight goes to 170 or higher, Call the office We could increase lasix up to 60 mg twice a day  Labwork:  No new labs needed  Testing/Procedures:  No further testing at this time   I recommend watching educational videos on topics of interest to you at:       [email or web address]  Enter code: [email or web address]    Follow-Up: It was a pleasure seeing you in the office today. Please call us if you have new issues that need to be addressed before your next appt.  [phone]  Your physician wants you to follow-up in: 2 month.   If you need a refill on your cardiac medications before your next appointment, please call your pharmacy.

## 2016-05-01 DIAGNOSIS — I472 Ventricular tachycardia: Secondary | ICD-10-CM | POA: Diagnosis not present

## 2016-05-01 DIAGNOSIS — I214 Non-ST elevation (NSTEMI) myocardial infarction: Secondary | ICD-10-CM | POA: Diagnosis not present

## 2016-05-01 DIAGNOSIS — E1122 Type 2 diabetes mellitus with diabetic chronic kidney disease: Secondary | ICD-10-CM | POA: Diagnosis not present

## 2016-05-01 DIAGNOSIS — I130 Hypertensive heart and chronic kidney disease with heart failure and stage 1 through stage 4 chronic kidney disease, or unspecified chronic kidney disease: Secondary | ICD-10-CM | POA: Diagnosis not present

## 2016-05-01 DIAGNOSIS — N184 Chronic kidney disease, stage 4 (severe): Secondary | ICD-10-CM | POA: Diagnosis not present

## 2016-05-01 DIAGNOSIS — I5043 Acute on chronic combined systolic (congestive) and diastolic (congestive) heart failure: Secondary | ICD-10-CM | POA: Diagnosis not present

## 2016-05-06 ENCOUNTER — Telehealth: Payer: Self-pay | Admitting: Family

## 2016-05-06 ENCOUNTER — Ambulatory Visit: Payer: Medicare Other | Admitting: Family

## 2016-05-06 NOTE — Telephone Encounter (Signed)
Patient did not show for his Heart Failure Clinic appointment on 05/06/16. Will attempt to reschedule.

## 2016-05-12 ENCOUNTER — Telehealth: Payer: Self-pay | Admitting: Cardiovascular Disease

## 2016-05-12 DIAGNOSIS — I5043 Acute on chronic combined systolic (congestive) and diastolic (congestive) heart failure: Secondary | ICD-10-CM | POA: Diagnosis not present

## 2016-05-12 DIAGNOSIS — I472 Ventricular tachycardia: Secondary | ICD-10-CM | POA: Diagnosis not present

## 2016-05-12 DIAGNOSIS — N184 Chronic kidney disease, stage 4 (severe): Secondary | ICD-10-CM | POA: Diagnosis not present

## 2016-05-12 DIAGNOSIS — I214 Non-ST elevation (NSTEMI) myocardial infarction: Secondary | ICD-10-CM | POA: Diagnosis not present

## 2016-05-12 DIAGNOSIS — E1122 Type 2 diabetes mellitus with diabetic chronic kidney disease: Secondary | ICD-10-CM | POA: Diagnosis not present

## 2016-05-12 DIAGNOSIS — I130 Hypertensive heart and chronic kidney disease with heart failure and stage 1 through stage 4 chronic kidney disease, or unspecified chronic kidney disease: Secondary | ICD-10-CM | POA: Diagnosis not present

## 2016-05-12 NOTE — Telephone Encounter (Signed)
Home health called wanting to see if it was OK to order heart failure program to do some education with the patient. She states that this will not interfere with the heart failure program that we provide but it will allow them to get a weight scale, blood pressure machine, and education on heart failure management. Let her know that as far as oxygen that would be his PCP or pulmonary physician. She verbalized understanding and had no further questions at this time.

## 2016-05-12 NOTE — Telephone Encounter (Signed)
[facility] would like a verbal order to pt pt on teaching program for HF. She would like to also know whom is managing his oxygen.

## 2016-05-15 DIAGNOSIS — I472 Ventricular tachycardia: Secondary | ICD-10-CM | POA: Diagnosis not present

## 2016-05-15 DIAGNOSIS — I130 Hypertensive heart and chronic kidney disease with heart failure and stage 1 through stage 4 chronic kidney disease, or unspecified chronic kidney disease: Secondary | ICD-10-CM | POA: Diagnosis not present

## 2016-05-15 DIAGNOSIS — I5043 Acute on chronic combined systolic (congestive) and diastolic (congestive) heart failure: Secondary | ICD-10-CM | POA: Diagnosis not present

## 2016-05-15 DIAGNOSIS — N184 Chronic kidney disease, stage 4 (severe): Secondary | ICD-10-CM | POA: Diagnosis not present

## 2016-05-15 DIAGNOSIS — E1122 Type 2 diabetes mellitus with diabetic chronic kidney disease: Secondary | ICD-10-CM | POA: Diagnosis not present

## 2016-05-15 DIAGNOSIS — I214 Non-ST elevation (NSTEMI) myocardial infarction: Secondary | ICD-10-CM | POA: Diagnosis not present

## 2016-05-18 DIAGNOSIS — I130 Hypertensive heart and chronic kidney disease with heart failure and stage 1 through stage 4 chronic kidney disease, or unspecified chronic kidney disease: Secondary | ICD-10-CM | POA: Diagnosis not present

## 2016-05-18 DIAGNOSIS — I5043 Acute on chronic combined systolic (congestive) and diastolic (congestive) heart failure: Secondary | ICD-10-CM | POA: Diagnosis not present

## 2016-05-18 DIAGNOSIS — N184 Chronic kidney disease, stage 4 (severe): Secondary | ICD-10-CM | POA: Diagnosis not present

## 2016-05-18 DIAGNOSIS — E1122 Type 2 diabetes mellitus with diabetic chronic kidney disease: Secondary | ICD-10-CM | POA: Diagnosis not present

## 2016-05-18 DIAGNOSIS — I472 Ventricular tachycardia: Secondary | ICD-10-CM | POA: Diagnosis not present

## 2016-05-18 DIAGNOSIS — I214 Non-ST elevation (NSTEMI) myocardial infarction: Secondary | ICD-10-CM | POA: Diagnosis not present

## 2016-05-19 DIAGNOSIS — I472 Ventricular tachycardia: Secondary | ICD-10-CM | POA: Diagnosis not present

## 2016-05-19 DIAGNOSIS — N184 Chronic kidney disease, stage 4 (severe): Secondary | ICD-10-CM | POA: Diagnosis not present

## 2016-05-19 DIAGNOSIS — I130 Hypertensive heart and chronic kidney disease with heart failure and stage 1 through stage 4 chronic kidney disease, or unspecified chronic kidney disease: Secondary | ICD-10-CM | POA: Diagnosis not present

## 2016-05-19 DIAGNOSIS — I5043 Acute on chronic combined systolic (congestive) and diastolic (congestive) heart failure: Secondary | ICD-10-CM | POA: Diagnosis not present

## 2016-05-19 DIAGNOSIS — E1122 Type 2 diabetes mellitus with diabetic chronic kidney disease: Secondary | ICD-10-CM | POA: Diagnosis not present

## 2016-05-19 DIAGNOSIS — I214 Non-ST elevation (NSTEMI) myocardial infarction: Secondary | ICD-10-CM | POA: Diagnosis not present

## 2016-05-22 DIAGNOSIS — E1122 Type 2 diabetes mellitus with diabetic chronic kidney disease: Secondary | ICD-10-CM | POA: Diagnosis not present

## 2016-05-22 DIAGNOSIS — N184 Chronic kidney disease, stage 4 (severe): Secondary | ICD-10-CM | POA: Diagnosis not present

## 2016-05-22 DIAGNOSIS — I214 Non-ST elevation (NSTEMI) myocardial infarction: Secondary | ICD-10-CM | POA: Diagnosis not present

## 2016-05-22 DIAGNOSIS — I5043 Acute on chronic combined systolic (congestive) and diastolic (congestive) heart failure: Secondary | ICD-10-CM | POA: Diagnosis not present

## 2016-05-22 DIAGNOSIS — I472 Ventricular tachycardia: Secondary | ICD-10-CM | POA: Diagnosis not present

## 2016-05-22 DIAGNOSIS — I130 Hypertensive heart and chronic kidney disease with heart failure and stage 1 through stage 4 chronic kidney disease, or unspecified chronic kidney disease: Secondary | ICD-10-CM | POA: Diagnosis not present

## 2016-05-26 DIAGNOSIS — N184 Chronic kidney disease, stage 4 (severe): Secondary | ICD-10-CM | POA: Diagnosis not present

## 2016-05-26 DIAGNOSIS — I472 Ventricular tachycardia: Secondary | ICD-10-CM | POA: Diagnosis not present

## 2016-05-26 DIAGNOSIS — I5043 Acute on chronic combined systolic (congestive) and diastolic (congestive) heart failure: Secondary | ICD-10-CM | POA: Diagnosis not present

## 2016-05-26 DIAGNOSIS — I214 Non-ST elevation (NSTEMI) myocardial infarction: Secondary | ICD-10-CM | POA: Diagnosis not present

## 2016-05-26 DIAGNOSIS — I130 Hypertensive heart and chronic kidney disease with heart failure and stage 1 through stage 4 chronic kidney disease, or unspecified chronic kidney disease: Secondary | ICD-10-CM | POA: Diagnosis not present

## 2016-05-26 DIAGNOSIS — E1122 Type 2 diabetes mellitus with diabetic chronic kidney disease: Secondary | ICD-10-CM | POA: Diagnosis not present

## 2016-05-28 DIAGNOSIS — N184 Chronic kidney disease, stage 4 (severe): Secondary | ICD-10-CM | POA: Diagnosis not present

## 2016-05-28 DIAGNOSIS — I5043 Acute on chronic combined systolic (congestive) and diastolic (congestive) heart failure: Secondary | ICD-10-CM | POA: Diagnosis not present

## 2016-05-28 DIAGNOSIS — E1122 Type 2 diabetes mellitus with diabetic chronic kidney disease: Secondary | ICD-10-CM | POA: Diagnosis not present

## 2016-05-28 DIAGNOSIS — I214 Non-ST elevation (NSTEMI) myocardial infarction: Secondary | ICD-10-CM | POA: Diagnosis not present

## 2016-05-28 DIAGNOSIS — I130 Hypertensive heart and chronic kidney disease with heart failure and stage 1 through stage 4 chronic kidney disease, or unspecified chronic kidney disease: Secondary | ICD-10-CM | POA: Diagnosis not present

## 2016-05-28 DIAGNOSIS — I472 Ventricular tachycardia: Secondary | ICD-10-CM | POA: Diagnosis not present

## 2016-05-29 DIAGNOSIS — I472 Ventricular tachycardia: Secondary | ICD-10-CM | POA: Diagnosis not present

## 2016-05-29 DIAGNOSIS — I214 Non-ST elevation (NSTEMI) myocardial infarction: Secondary | ICD-10-CM | POA: Diagnosis not present

## 2016-05-29 DIAGNOSIS — I5043 Acute on chronic combined systolic (congestive) and diastolic (congestive) heart failure: Secondary | ICD-10-CM | POA: Diagnosis not present

## 2016-05-29 DIAGNOSIS — E1122 Type 2 diabetes mellitus with diabetic chronic kidney disease: Secondary | ICD-10-CM | POA: Diagnosis not present

## 2016-05-29 DIAGNOSIS — N184 Chronic kidney disease, stage 4 (severe): Secondary | ICD-10-CM | POA: Diagnosis not present

## 2016-05-29 DIAGNOSIS — I130 Hypertensive heart and chronic kidney disease with heart failure and stage 1 through stage 4 chronic kidney disease, or unspecified chronic kidney disease: Secondary | ICD-10-CM | POA: Diagnosis not present

## 2016-06-02 DIAGNOSIS — I130 Hypertensive heart and chronic kidney disease with heart failure and stage 1 through stage 4 chronic kidney disease, or unspecified chronic kidney disease: Secondary | ICD-10-CM | POA: Diagnosis not present

## 2016-06-02 DIAGNOSIS — E1122 Type 2 diabetes mellitus with diabetic chronic kidney disease: Secondary | ICD-10-CM | POA: Diagnosis not present

## 2016-06-02 DIAGNOSIS — I472 Ventricular tachycardia: Secondary | ICD-10-CM | POA: Diagnosis not present

## 2016-06-02 DIAGNOSIS — I214 Non-ST elevation (NSTEMI) myocardial infarction: Secondary | ICD-10-CM | POA: Diagnosis not present

## 2016-06-02 DIAGNOSIS — N184 Chronic kidney disease, stage 4 (severe): Secondary | ICD-10-CM | POA: Diagnosis not present

## 2016-06-02 DIAGNOSIS — I5043 Acute on chronic combined systolic (congestive) and diastolic (congestive) heart failure: Secondary | ICD-10-CM | POA: Diagnosis not present

## 2016-06-04 DIAGNOSIS — I472 Ventricular tachycardia: Secondary | ICD-10-CM | POA: Diagnosis not present

## 2016-06-04 DIAGNOSIS — I214 Non-ST elevation (NSTEMI) myocardial infarction: Secondary | ICD-10-CM | POA: Diagnosis not present

## 2016-06-04 DIAGNOSIS — N184 Chronic kidney disease, stage 4 (severe): Secondary | ICD-10-CM | POA: Diagnosis not present

## 2016-06-04 DIAGNOSIS — I5043 Acute on chronic combined systolic (congestive) and diastolic (congestive) heart failure: Secondary | ICD-10-CM | POA: Diagnosis not present

## 2016-06-04 DIAGNOSIS — E1122 Type 2 diabetes mellitus with diabetic chronic kidney disease: Secondary | ICD-10-CM | POA: Diagnosis not present

## 2016-06-04 DIAGNOSIS — I130 Hypertensive heart and chronic kidney disease with heart failure and stage 1 through stage 4 chronic kidney disease, or unspecified chronic kidney disease: Secondary | ICD-10-CM | POA: Diagnosis not present

## 2016-06-18 ENCOUNTER — Ambulatory Visit: Payer: Medicare Other | Admitting: Cardiovascular Disease

## 2017-09-09 IMAGING — US US RENAL
1 series · 14 of 25 positions shown · non-contrast
Comparison: None.

CLINICAL DATA: Acute renal failure .

EXAM:
RENAL / URINARY TRACT ULTRASOUND COMPLETE

[Series 1: us renal · 0.26mm/px · 14 of 44 slices shown]
[im 1/44]
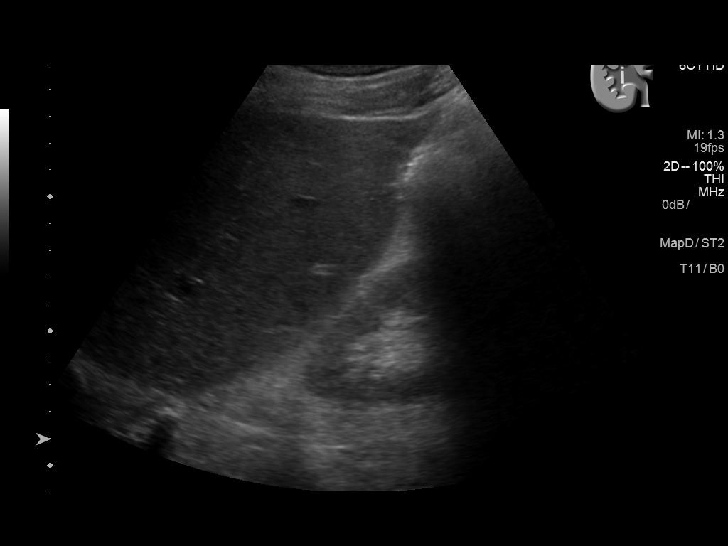
[im 4/44]
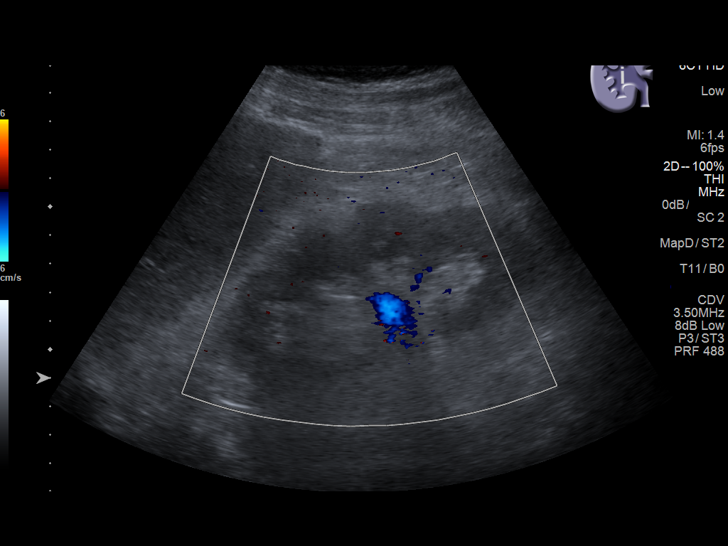
[im 8/44]
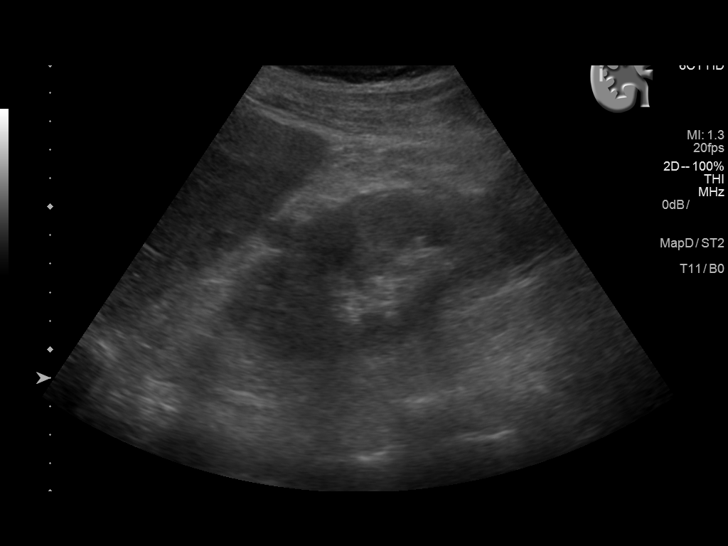
[im 11/44]
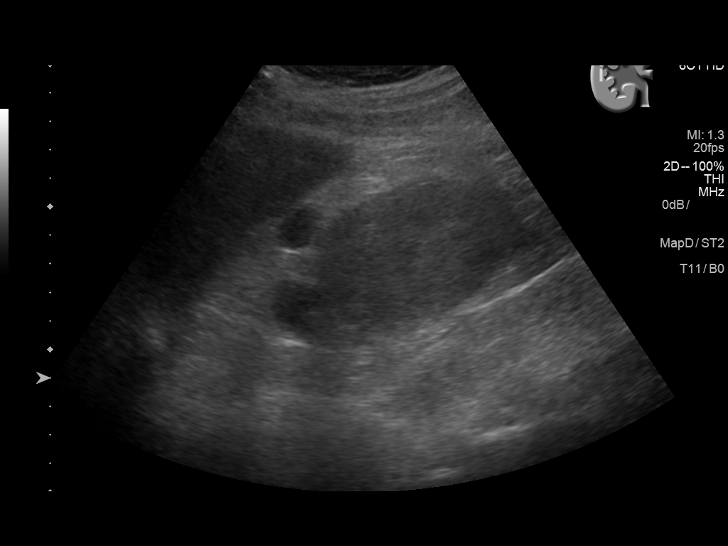
[im 15/44]
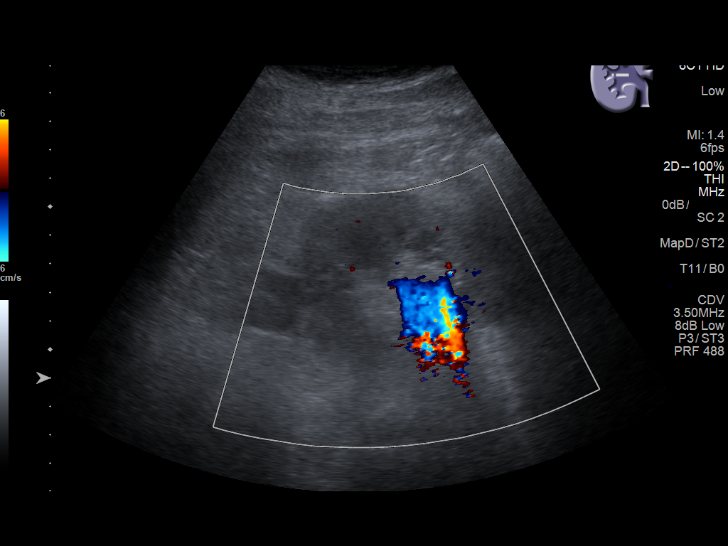
[im 17/44]
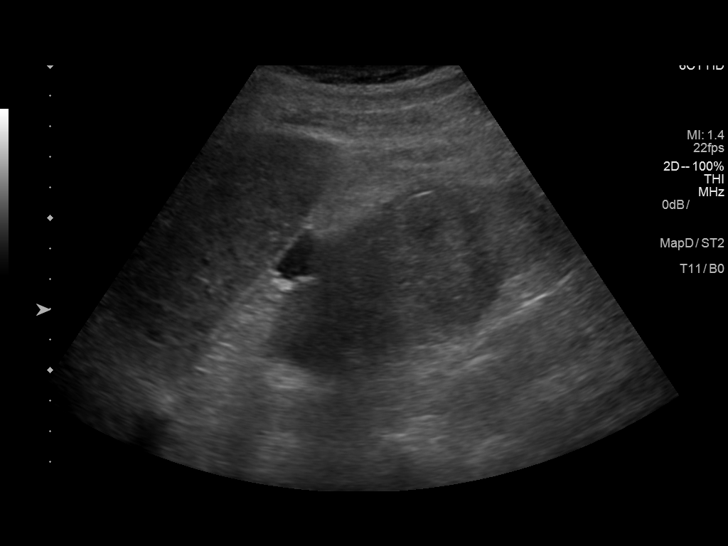
[im 20/44]
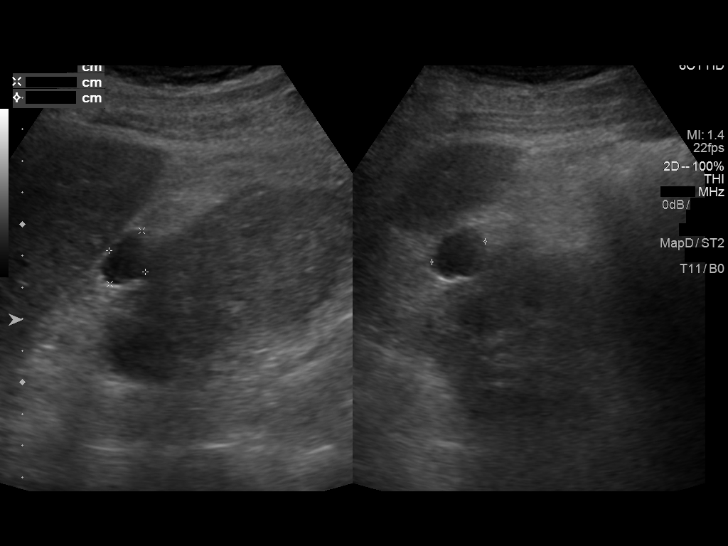
[im 24/44]
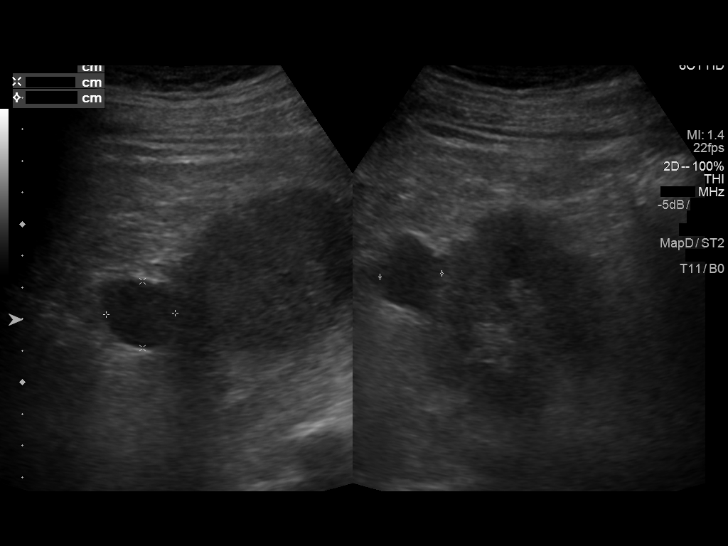
[im 27/44]
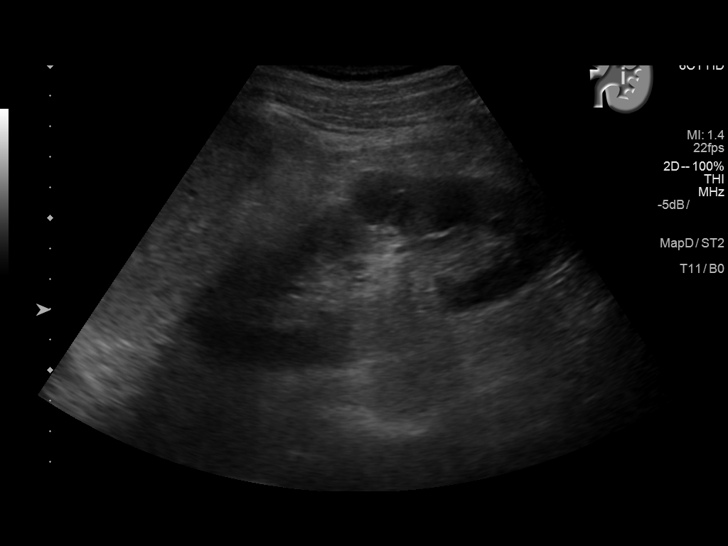
[im 29/44]
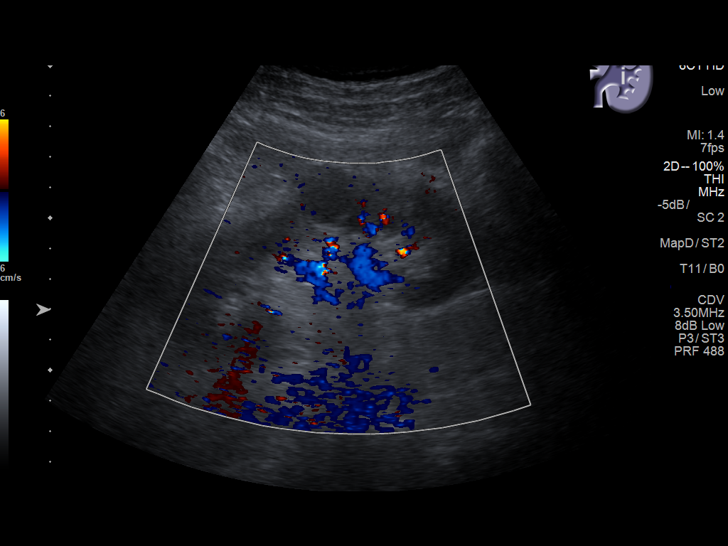
[im 33/44]
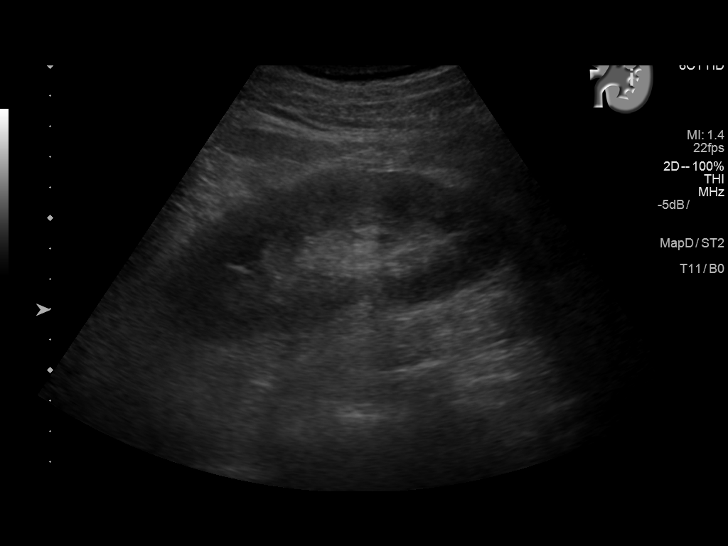
[im 36/44]
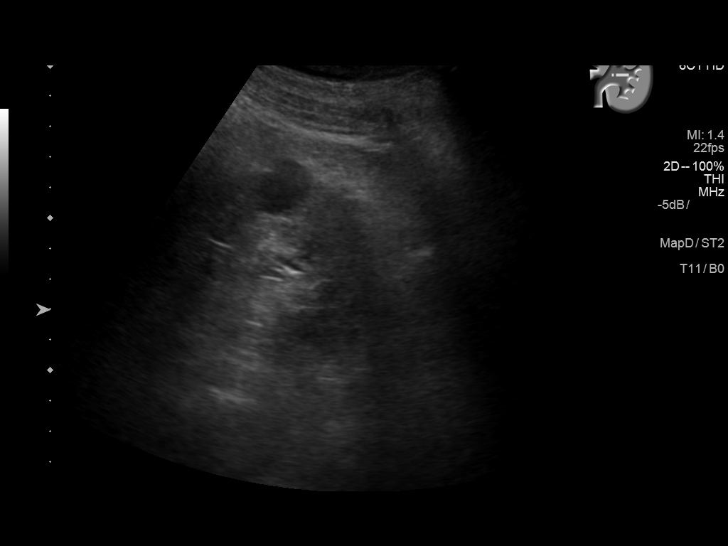
[im 40/44]
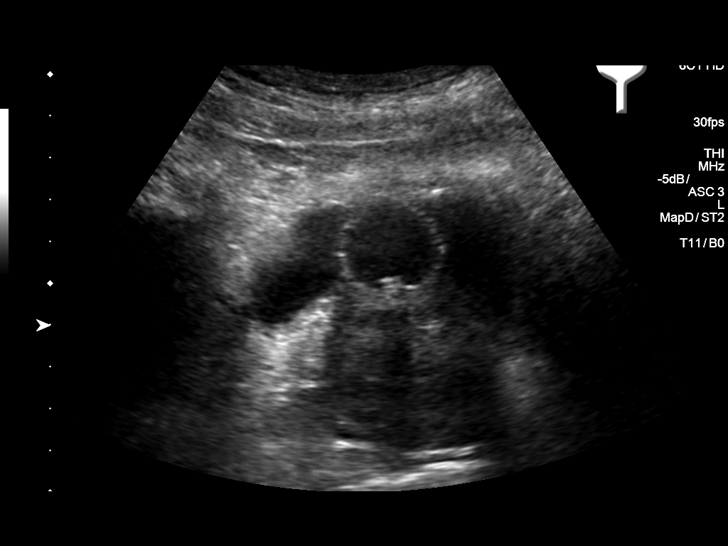
[im 44/44]
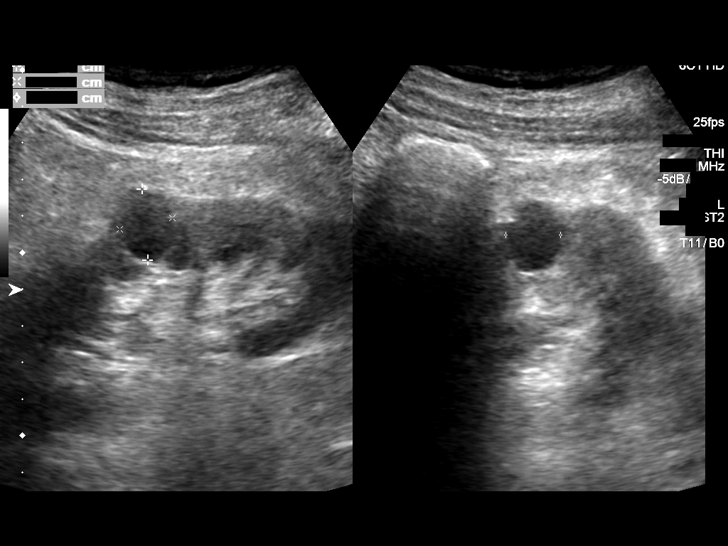

[14 of 25 positions shown; findings below may reference images not displayed]

FINDINGS: Right Kidney:

Length: 12.2 cm. Echogenicity within normal limits. No
hydronephrosis visualized. Two simple cysts in the right kidney
largest measures 2.2 cm.

Left Kidney:

Length: 12.8 cm. Echogenicity within normal limits. No
hydronephrosis visualized. A 2 cm simple cyst is noted in the left
kidney.

Bladder:

Appears normal for degree of bladder distention. Foley catheter is
in place.
IMPRESSION: No acute abnormality identified. Simple cysts noted both kidneys. No
hydronephrosis or bladder distention. Foley catheter is in place.

## 2017-11-18 ENCOUNTER — Encounter: Payer: Self-pay | Admitting: Podiatry

## 2017-11-18 ENCOUNTER — Ambulatory Visit (INDEPENDENT_AMBULATORY_CARE_PROVIDER_SITE_OTHER): Payer: No Typology Code available for payment source | Admitting: Podiatry

## 2017-11-18 DIAGNOSIS — E1142 Type 2 diabetes mellitus with diabetic polyneuropathy: Secondary | ICD-10-CM | POA: Diagnosis not present

## 2017-11-18 DIAGNOSIS — L608 Other nail disorders: Secondary | ICD-10-CM | POA: Diagnosis not present

## 2017-11-18 DIAGNOSIS — B351 Tinea unguium: Secondary | ICD-10-CM | POA: Diagnosis not present

## 2017-11-18 NOTE — Progress Notes (Signed)
This patient presents to the office with chief complaint of long thick nails and diabetic feet.  This patient  says there  is  no pain and discomfort in his  feet.  This patient says there are long thick painful nails.  These nails are painful walking and wearing shoes.  Patient has no history of infection or drainage from both feet.  Patient is unable to  self treat his own nails . This patient presents  to the office today for treatment of the  long nails and a foot evaluation due to history of  diabetes.  General Appearance  Alert, conversant and in no acute stress.  Vascular  Dorsalis pedis and posterior tibial  pulses are palpable  bilaterally.  Capillary return is within normal limits  bilaterally. Temperature is within normal limits  bilaterally.  Neurologic  Senn-Weinstein monofilament wire test absent bilaterally. Muscle power within normal limits bilaterally.  Nails Thick disfigured discolored nails with subungual debris  from hallux to fifth toes bilaterally. No evidence of bacterial infection or drainage bilaterally.  Orthopedic  No limitations of motion of motion feet .  No crepitus or effusions noted.  No bony pathology or digital deformities noted.  Skin  normotropic skin with no porokeratosis noted bilaterally.  No signs of infections or ulcers noted.     Onychomycosis  Diabetes with neuropathy  IE  Debride nails x 10.  A diabetic foot exam was performed and there is no evidence of any vascular  pathology. Absent  LOPS  B/L.  RTC 3 months.   Gardiner Barefoot DPM

## 2018-02-17 ENCOUNTER — Encounter: Payer: Self-pay | Admitting: Podiatry

## 2018-02-17 ENCOUNTER — Ambulatory Visit (INDEPENDENT_AMBULATORY_CARE_PROVIDER_SITE_OTHER): Payer: No Typology Code available for payment source | Admitting: Podiatry

## 2018-02-17 DIAGNOSIS — L608 Other nail disorders: Secondary | ICD-10-CM

## 2018-02-17 DIAGNOSIS — E1142 Type 2 diabetes mellitus with diabetic polyneuropathy: Secondary | ICD-10-CM

## 2018-02-17 DIAGNOSIS — B351 Tinea unguium: Secondary | ICD-10-CM | POA: Diagnosis not present

## 2018-02-17 NOTE — Progress Notes (Signed)
Complaint:  Visit Type: Patient returns to my office for continued preventative foot care services. Complaint: Patient states" my nails have grown long and thick and become painful to walk and wear shoes" Patient has been diagnosed with DM with neuropathy. The patient presents for preventative foot care services. No changes to ROS  Podiatric Exam: Vascular: dorsalis pedis and posterior tibial pulses are palpable bilateral. Capillary return is immediate. Temperature gradient is WNL. Skin turgor WNL  Sensorium: Absent  Semmes Weinstein monofilament test. Absent  tactile sensation bilaterally. Nail Exam: Pt has thick disfigured discolored nails with subungual debris noted bilateral entire nail hallux through fifth toenails.  Pincer nails. Ulcer Exam: There is no evidence of ulcer or pre-ulcerative changes or infection. Orthopedic Exam: Muscle tone and strength are WNL. No limitations in general ROM. No crepitus or effusions noted. Foot type and digits show no abnormalities. Bony prominences are unremarkable. Skin: No Porokeratosis. No infection or ulcers  Diagnosis:  Onychomycosis, , Pain in right toe, pain in left toes Diabetes with neuropathy  Treatment & Plan Procedures and Treatment: Consent by patient was obtained for treatment procedures.   Debridement of mycotic and hypertrophic toenails, 1 through 5 bilateral and clearing of subungual debris. No ulceration, no infection noted.  Return Visit-Office Procedure: Patient instructed to return to the office for a follow up visit 10 weeks  for continued evaluation and treatment.    Gardiner Barefoot DPM

## 2018-03-24 IMAGING — DX DG CHEST 1V
1 series · 1 of 1 positions shown · non-contrast
Comparison: None.

CLINICAL DATA: Respiratory distress.  Emesis.

EXAM:
CHEST 1 VIEW

[chest ap]
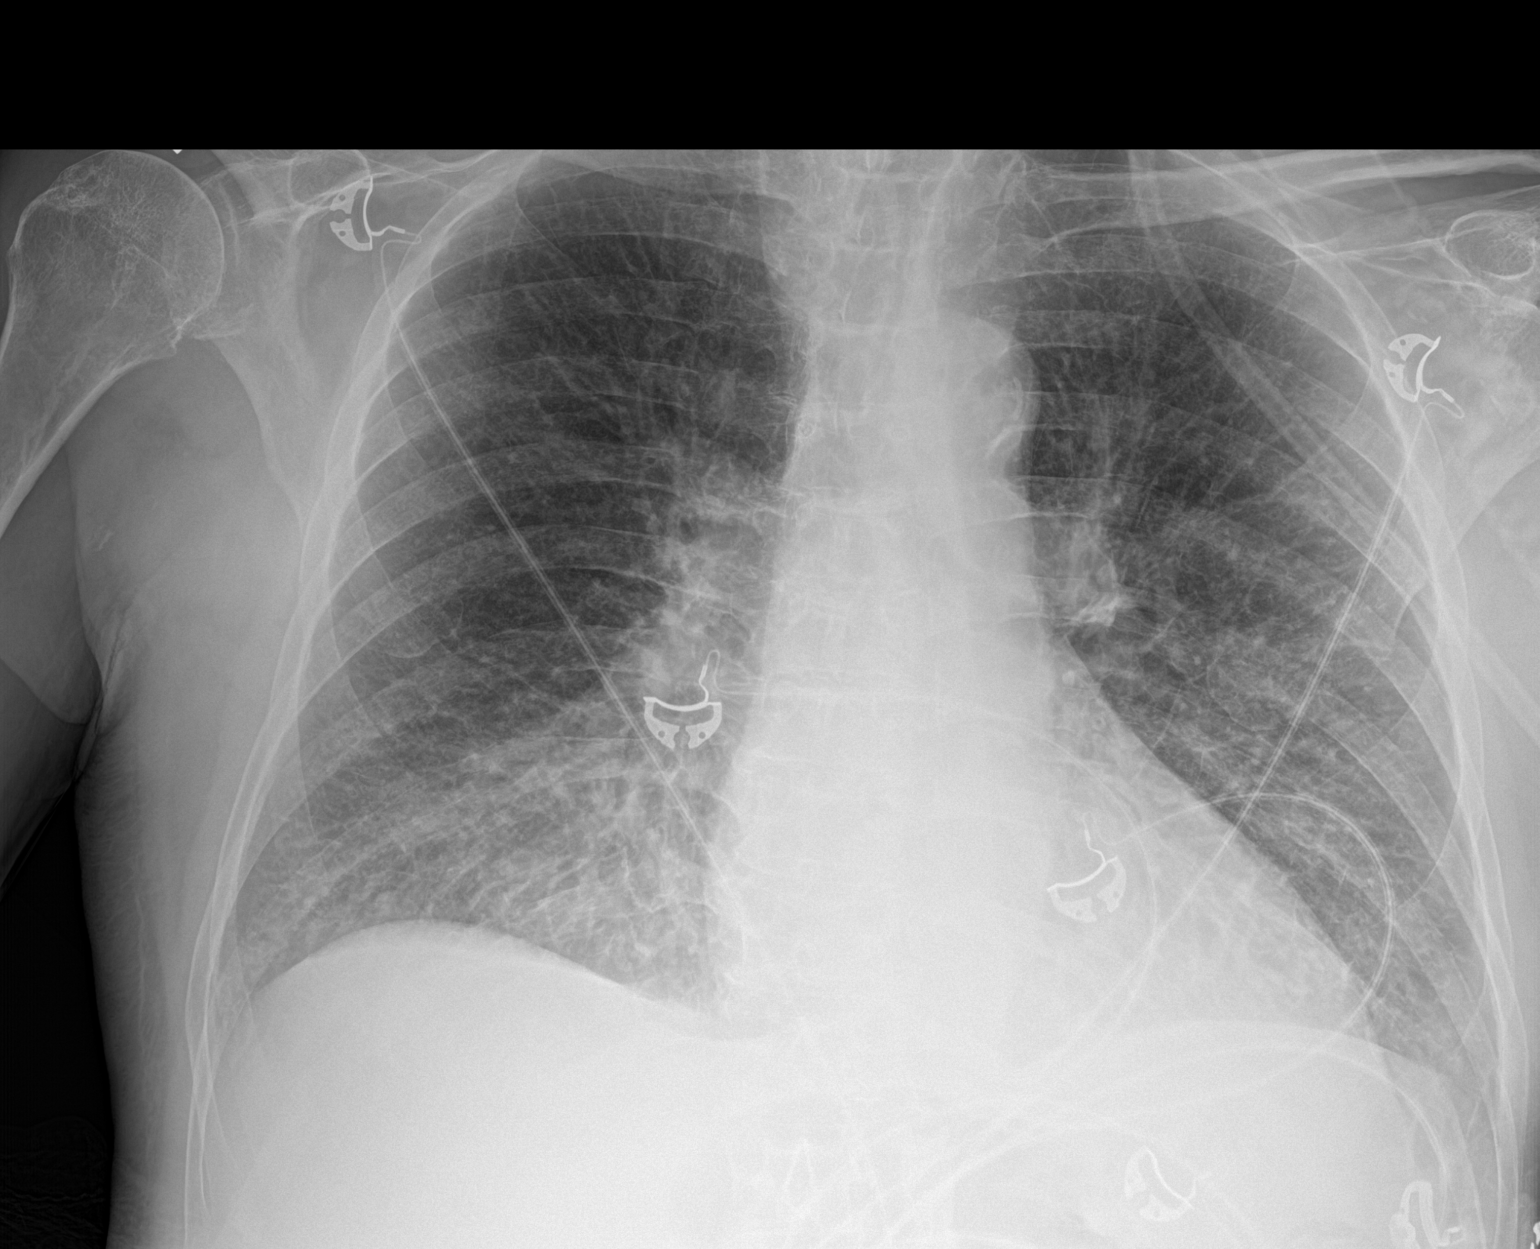

[1 of 1 positions shown; findings below may reference images not displayed]

FINDINGS: Normal heart size and pulmonary vascularity. Diffuse perihilar
peribronchial thickening with diffuse interstitial pattern to the
lungs. This may represent interstitial pneumonia or edema. No
blunting of costophrenic angles. No pneumothorax. Mediastinal
contours appear intact. Calcification of the aorta.
IMPRESSION: Peribronchial thickening with diffuse interstitial pattern to the
lungs suggesting interstitial pneumonia or edema.

## 2018-05-05 ENCOUNTER — Ambulatory Visit: Payer: No Typology Code available for payment source | Admitting: Podiatry

## 2018-05-09 ENCOUNTER — Encounter: Payer: Self-pay | Admitting: Podiatry

## 2018-05-09 ENCOUNTER — Ambulatory Visit (INDEPENDENT_AMBULATORY_CARE_PROVIDER_SITE_OTHER): Payer: No Typology Code available for payment source | Admitting: Podiatry

## 2018-05-09 ENCOUNTER — Other Ambulatory Visit: Payer: Self-pay

## 2018-05-09 DIAGNOSIS — E1142 Type 2 diabetes mellitus with diabetic polyneuropathy: Secondary | ICD-10-CM | POA: Diagnosis not present

## 2018-05-09 DIAGNOSIS — B351 Tinea unguium: Secondary | ICD-10-CM

## 2018-05-09 DIAGNOSIS — L608 Other nail disorders: Secondary | ICD-10-CM

## 2018-05-09 NOTE — Progress Notes (Signed)
Complaint:  Visit Type: Patient returns to my office for continued preventative foot care services. Complaint: Patient states" my nails have grown long and thick and become painful to walk and wear shoes" Patient has been diagnosed with DM with neuropathy. The patient presents for preventative foot care services. No changes to ROS  Podiatric Exam: Vascular: dorsalis pedis and posterior tibial pulses are palpable bilateral. Capillary return is immediate. Temperature gradient is WNL. Skin turgor WNL  Sensorium: Absent  Semmes Weinstein monofilament test. Absent  tactile sensation bilaterally. Nail Exam: Pt has thick disfigured discolored nails with subungual debris noted bilateral entire nail hallux through fifth toenails.  Pincer nails. Ulcer Exam: There is no evidence of ulcer or pre-ulcerative changes or infection. Orthopedic Exam: Muscle tone and strength are WNL. No limitations in general ROM. No crepitus or effusions noted. Foot type and digits show no abnormalities. Bony prominences are unremarkable. Skin: No Porokeratosis. No infection or ulcers  Diagnosis:  Onychomycosis, , Pain in right toe, pain in left toes Diabetes with neuropathy  Treatment & Plan Procedures and Treatment: Consent by patient was obtained for treatment procedures.   Debridement of mycotic and hypertrophic toenails, 1 through 5 bilateral and clearing of subungual debris. No ulceration, no infection noted.  Return Visit-Office Procedure: Patient instructed to return to the office for a follow up visit 10 weeks  for continued evaluation and treatment.    Gardiner Barefoot DPM

## 2018-05-24 ENCOUNTER — Encounter: Payer: Self-pay | Admitting: Emergency Medicine

## 2018-05-24 ENCOUNTER — Emergency Department
Admission: EM | Admit: 2018-05-24 | Discharge: 2018-05-25 | Disposition: A | Discharge status: Other Acute Inpatient Hospital | Payer: Medicare Other | Attending: Emergency Medicine | Admitting: Emergency Medicine

## 2018-05-24 ENCOUNTER — Other Ambulatory Visit: Payer: Self-pay

## 2018-05-24 DIAGNOSIS — R140 Abdominal distension (gaseous): Secondary | ICD-10-CM | POA: Diagnosis not present

## 2018-05-24 DIAGNOSIS — R1084 Generalized abdominal pain: Secondary | ICD-10-CM | POA: Diagnosis not present

## 2018-05-24 DIAGNOSIS — Z87891 Personal history of nicotine dependence: Secondary | ICD-10-CM | POA: Insufficient documentation

## 2018-05-24 DIAGNOSIS — Z794 Long term (current) use of insulin: Secondary | ICD-10-CM | POA: Diagnosis not present

## 2018-05-24 DIAGNOSIS — C259 Malignant neoplasm of pancreas, unspecified: Secondary | ICD-10-CM | POA: Insufficient documentation

## 2018-05-24 DIAGNOSIS — I5042 Chronic combined systolic (congestive) and diastolic (congestive) heart failure: Secondary | ICD-10-CM | POA: Diagnosis not present

## 2018-05-24 DIAGNOSIS — Z7982 Long term (current) use of aspirin: Secondary | ICD-10-CM | POA: Insufficient documentation

## 2018-05-24 DIAGNOSIS — E860 Dehydration: Secondary | ICD-10-CM | POA: Diagnosis not present

## 2018-05-24 DIAGNOSIS — N186 End stage renal disease: Secondary | ICD-10-CM | POA: Insufficient documentation

## 2018-05-24 DIAGNOSIS — I10 Essential (primary) hypertension: Secondary | ICD-10-CM | POA: Diagnosis not present

## 2018-05-24 DIAGNOSIS — I132 Hypertensive heart and chronic kidney disease with heart failure and with stage 5 chronic kidney disease, or end stage renal disease: Secondary | ICD-10-CM | POA: Diagnosis not present

## 2018-05-24 DIAGNOSIS — E1122 Type 2 diabetes mellitus with diabetic chronic kidney disease: Secondary | ICD-10-CM | POA: Insufficient documentation

## 2018-05-24 DIAGNOSIS — R109 Unspecified abdominal pain: Secondary | ICD-10-CM | POA: Diagnosis not present

## 2018-05-24 DIAGNOSIS — R52 Pain, unspecified: Secondary | ICD-10-CM

## 2018-05-24 DIAGNOSIS — Z79899 Other long term (current) drug therapy: Secondary | ICD-10-CM | POA: Insufficient documentation

## 2018-05-24 LAB — CBC
HCT: 29.9 % — ABNORMAL LOW (ref 39.0–52.0)
Hemoglobin: 9.4 g/dL — ABNORMAL LOW (ref 13.0–17.0)
MCH: 29.1 pg (ref 26.0–34.0)
MCHC: 31.4 g/dL (ref 30.0–36.0)
MCV: 92.6 fL (ref 80.0–100.0)
Platelets: 173 10*3/uL (ref 150–400)
RBC: 3.23 MIL/uL — ABNORMAL LOW (ref 4.22–5.81)
RDW: 13.9 % (ref 11.5–15.5)
WBC: 5.9 10*3/uL (ref 4.0–10.5)
nRBC: 0 % (ref 0.0–0.2)

## 2018-05-24 LAB — COMPREHENSIVE METABOLIC PANEL
ALT: 14 U/L (ref 0–44)
AST: 25 U/L (ref 15–41)
Albumin: 1.9 g/dL — ABNORMAL LOW (ref 3.5–5.0)
Alkaline Phosphatase: 104 U/L (ref 38–126)
Anion gap: 9 (ref 5–15)
BUN: 39 mg/dL — ABNORMAL HIGH (ref 8–23)
CO2: 27 mmol/L (ref 22–32)
Calcium: 7.6 mg/dL — ABNORMAL LOW (ref 8.9–10.3)
Chloride: 104 mmol/L (ref 98–111)
Creatinine, Ser: 3.86 mg/dL — ABNORMAL HIGH (ref 0.61–1.24)
GFR calc Af Amer: 17 mL/min — ABNORMAL LOW (ref >60)
GFR calc non Af Amer: 15 mL/min — ABNORMAL LOW (ref >60)
Glucose, Bld: 181 mg/dL — ABNORMAL HIGH (ref 70–99)
Potassium: 3.9 mmol/L (ref 3.5–5.1)
Sodium: 140 mmol/L (ref 135–145)
Total Bilirubin: 0.4 mg/dL (ref 0.3–1.2)
Total Protein: 5.4 g/dL — ABNORMAL LOW (ref 6.5–8.1)

## 2018-05-24 LAB — LIPASE, BLOOD: Lipase: 14 U/L (ref 11–51)

## 2018-05-24 MED ORDER — MORPHINE SULFATE (PF) 4 MG/ML IV SOLN
4.0000 mg | Freq: Once | INTRAVENOUS | Status: AC
  Administered 2018-05-24: 4 mg via INTRAVENOUS
  Filled 2018-05-24: qty 1

## 2018-05-24 MED ORDER — ONDANSETRON HCL 4 MG/2ML IJ SOLN
4.0000 mg | Freq: Once | INTRAMUSCULAR | Status: AC
  Administered 2018-05-24: 4 mg via INTRAVENOUS
  Filled 2018-05-24: qty 2

## 2018-05-24 MED ORDER — MORPHINE SULFATE (PF) 2 MG/ML IV SOLN
2.0000 mg | Freq: Once | INTRAVENOUS | Status: AC
  Administered 2018-05-24: 23:00:00 2 mg via INTRAVENOUS
  Filled 2018-05-24: qty 1

## 2018-05-24 NOTE — ED Notes (Signed)
2nd divided dose of 2mg  Morphine given

## 2018-05-24 NOTE — ED Triage Notes (Addendum)
Pt to triage via w/c, appears uncomfortable; home mask in place; pt with hx pancreatic CA; c/o lower abd pain and distention and constipation; pt has portacath, last chemo 2wks ago

## 2018-05-24 NOTE — ED Provider Notes (Signed)
[facility] Emergency Department Provider Note    First MD Initiated Contact with Patient 05/24/18 2215     (approximate)  I have reviewed the triage vital signs and the nursing notes.   HISTORY  Chief Complaint Abdominal Pain   HPI Colton Thompson is a 73 y.o. male with medical history as listed below including chronic systolic congestive heart failure diabetes metastatic pancreatic cancer currently under treatment at the [facility] in presents to the emergency department with progressively worsening abdominal discomfort 10 out of 10 pain and inability to tolerate drinking and eating today.  Patient's last chemotherapy was performed 2 weeks ago.  Patient's wife states that the patient was unable to ambulate today secondary to generalized weakness.       Past Medical History:  Diagnosis Date  . Chronic systolic CHF (congestive heart failure) ([place])    a. 03/2016 Echo: EF 20-25%, diff HK, mild to mod MR, mildly dil LA, mild to mod TR, PASP 68mmHg.  . CKD (chronic kidney disease), stage IV ([place])   . DM (diabetes mellitus), type 2 ([place])   . HTN (hypertension)   . NICM (nonischemic cardiomyopathy) ([place])    a. 03/2016 Echo: Ef 20-25%.  . Non-obstructive CAD (coronary artery disease)    a. 03/2016 Cath: LM nl, LAD 60p, 58m, 60d, D1/2 min irregs, D3 nl, LCX 70ost/p, OM1/2/3 nl, RCA 60p, 27m/d, RPDA min irregs.  . Obstructive sleep apnea   . Poor circulation [facility]  . Tobacco abuse     Patient Active Problem List   Diagnosis Date Noted  . Chronic systolic CHF (congestive heart failure) ([place]) 04/10/2016  . HTN (hypertension) 04/10/2016  . Diabetes ([place]) 04/10/2016  . Sleep apnea 04/10/2016  . CHF (congestive heart failure) ([place]) 04/10/2016  . Malignant hypertension   . End stage renal disease ([place])   . VT (ventricular tachycardia) ([place])   . Chronic renal failure, stage 4 (severe) ([facility])   . Ischemic cardiomyopathy   . Acute on chronic combined systolic and  diastolic CHF (congestive heart failure) ([place]) 03/28/2016  . Dilated cardiomyopathy ([place])   . Hypertensive urgency   . Centrilobular emphysema ([place])   . Tobacco abuse   . Pulmonary hypertension ([place])     Past Surgical History:  Procedure Laterality Date  . ABDOMINAL HERNIA REPAIR    . CARDIAC CATHETERIZATION    . RIGHT/LEFT HEART CATH AND CORONARY ANGIOGRAPHY N/A 04/03/2016   Procedure: Right/Left Heart Cath and Coronary Angiography;  Surgeon: Wellington Hampshire, MD;  Location: [facility];  Service: Cardiovascular;  Laterality: N/A;    Prior to Admission medications   Medication Sig Start Date End Date Taking? Authorizing Provider  acetaminophen (TYLENOL) 325 MG tablet Take 650 mg by mouth every 6 (six) hours as needed.    [provider]  albuterol (PROVENTIL HFA;VENTOLIN HFA) 108 (90 Base) MCG/ACT inhaler Inhale 1-2 puffs into the lungs every 6 (six) hours as needed for wheezing or shortness of breath.    [provider]  amLODipine (NORVASC) 10 MG tablet Take 1 tablet (10 mg total) by mouth daily. 04/15/16   [name], MD  aspirin EC 81 MG EC tablet Take 1 tablet (81 mg total) by mouth daily. 04/05/16   Vaughan Basta, MD  carvedilol (COREG) 6.25 MG tablet Take 6.25 mg by mouth 2 (two) times daily with a meal.    [provider]  docusate sodium (COLACE) 50 MG capsule Take 50 mg  by mouth 2 (two) times daily.    [provider]  finasteride (PROSCAR) 5 MG tablet Take 5 mg by mouth daily.    [provider]  fluticasone (VERAMYST) 27.5 MCG/SPRAY nasal spray Place 2 sprays into the nose daily.    [provider]  hydrALAZINE (APRESOLINE) 100 MG tablet Take 1 tablet (100 mg total) by mouth 4 (four) times daily. 04/14/16 05/14/16  [name], MD  insulin glargine (LANTUS) 100 UNIT/ML injection Inject 15 Units into the skin at bedtime.    [provider]  isosorbide mononitrate (IMDUR) 120 MG 24 hr tablet  Take 1 tablet (120 mg total) by mouth daily. 04/14/16   [name], MD  magnesium oxide (MAG-OX) 400 MG tablet Take 400 mg by mouth daily.    [provider]  omeprazole (PRILOSEC) 20 MG capsule Take 20 mg by mouth 2 (two) times daily before a meal.    [provider]  ondansetron (ZOFRAN) 8 MG tablet Take by mouth every 8 (eight) hours as needed for nausea or vomiting.    [provider]  potassium chloride SA (K-DUR,KLOR-CON) 20 MEQ tablet Take 20 mEq by mouth 2 (two) times daily.    [provider]  pravastatin (PRAVACHOL) 80 MG tablet Take 80 mg by mouth daily.    [provider]  sodium bicarbonate 650 MG tablet Take 650 mg by mouth 4 (four) times daily.    [provider]  tamsulosin (FLOMAX) 0.4 MG CAPS capsule Take 1 capsule (0.4 mg total) by mouth daily. 04/05/16   Vaughan Basta, MD  torsemide (DEMADEX) 10 MG tablet Take 10 mg by mouth daily.    [provider]    Allergies Patient has no known allergies.  Family History  Problem Relation Age of Onset  . Stroke Mother   . Heart attack Mother   . Stroke Father   . Hypertension Father   . Cancer Brother        Bone (Jaw)  . Stroke Sister   . Stroke Brother   . Stroke Brother     Social History Social History   Tobacco Use  . Smoking status: Former Smoker    Packs/day: 0.50    Years: 50.00    Pack years: 25.00    Types: Cigarettes    Start date: 02/17/1968    Last attempt to quit: 03/25/2016    Years since quitting: 2.1  . Smokeless tobacco: Never Used  Substance Use Topics  . Alcohol use: No  . Drug use: No    Review of Systems Constitutional: No fever/chills Eyes: No visual changes. ENT: No sore throat. Cardiovascular: Denies chest pain. Respiratory: Denies shortness of breath. Gastrointestinal: Positive for generalized abdominal distention and  pain.  Positive for nausea vomiting inability to tolerate no diarrhea.  No constipation.  Genitourinary: Negative for dysuria. Musculoskeletal: Negative for neck pain.  Negative for back pain. Integumentary: Negative for rash. Neurological: Negative for headaches, focal weakness or numbness.   ____________________________________________   PHYSICAL EXAM:  VITAL SIGNS: ED Triage Vitals  Enc Vitals Group     BP 05/24/18 2159 (!) 85/56     Pulse Rate 05/24/18 2159 90     Resp 05/24/18 2159 18     Temp 05/24/18 2159 98.3 F (36.8 C)     Temp Source 05/24/18 2159 Oral     SpO2 05/24/18 2159 98 %     Weight 05/24/18 2159 77.4 kg (170 lb 9.6 oz)  Height 05/24/18 2159 1.829 m (6')     Head Circumference --      Peak Flow --      Pain Score 05/24/18 2158 10     Pain Loc --      Pain Edu? --      Excl. in [place]? --     Constitutional: Alert and oriented.  Ill-appearing.  Apparent discomfort eyes: Conjunctivae are normal. PERRL. EOMI. positive for scleral icterus Mouth/Throat: Mucous membranes are dry.  Oropharynx non-erythematous. Neck: No stridor.   Cardiovascular: Normal rate, regular rhythm. Good peripheral circulation. Grossly normal heart sounds. Respiratory: Normal respiratory effort.  No retractions. Lungs CTAB. Gastrointestinal: Marked abdominal distention, t positive fluid wave.  Musculoskeletal: No lower extremity tenderness nor edema. No gross deformities of extremities. Neurologic:  Normal speech and language. No gross focal neurologic deficits are appreciated.  Skin:  Skin is warm, dry and intact. No rash noted. Psychiatric: Mood and affect are normal. Speech and behavior are normal.  ____________________________________________   LABS (all labs ordered are listed, but only abnormal results are displayed)  Labs Reviewed  CBC  COMPREHENSIVE METABOLIC PANEL  LIPASE, BLOOD   ____________________________________________  EKG  ED ECG REPORT I, [place], the attending physician, personally viewed and interpreted this ECG.   Date:  05/24/2018  EKG Time: 10:15 PM  Rate: 94  Rhythm: Normal sinus rhythm  Axis: Normal  Intervals: Normal  ST&T Change: None   _______  Procedures   ____________________________________________   INITIAL IMPRESSION / MDM / ASSESSMENT AND PLAN / ED COURSE  As part of my medical decision making, I reviewed the following data within the [place] was evaluated in Emergency Department on 05/24/2018 for the symptoms described in the history of present illness. He was evaluated in the context of the global COVID-19 pandemic, which necessitated consideration that the patient might be at risk for infection with the SARS-CoV-2 virus that causes COVID-19. Institutional protocols and algorithms that pertain to the evaluation of patients at risk for COVID-19 are in a state of rapid change based on information released by regulatory bodies including the CDC and federal and state organizations. These policies and algorithms were followed during the patient's care in the ED.     73 year old male presenting with above-stated history and physical exam secondary to worsening pain inability to tolerate eating drinking and generalized weakness.  Patient given multiple doses of IV morphine in the emergency department with pain improvement however not resolved.  Patient appears clinically dehydrated creatinine increased [facility] creatinine was 3.3 is consistent with patient's reported history of decreased p.o. intake.  Consider the possibility of spontaneous bacterial peritonitis however patient is afebrile white blood cell count normal abdomen while markedly distended is not tender to touch.  Hence I suspect the patient is in need of a therapeutic paracentesis and continued pain control with meticulous rehydration.  Patient [facility]. ____________________________________________  FINAL CLINICAL IMPRESSION(S)  / ED DIAGNOSES  Final diagnoses:  None     MEDICATIONS GIVEN DURING THIS VISIT:  Medications  morphine 2 MG/ML injection 2 mg (has no administration in time range)  ondansetron (ZOFRAN) injection 4 mg (has no administration in time range)     ED Discharge Orders    None       Note:  This document was prepared using Dragon voice recognition software and may include unintentional dictation  errors.   Gregor Hams, MD 05/25/18 [phone]

## 2018-05-24 NOTE — ED Notes (Signed)
Portacath accessed by Judith Part. RN

## 2018-05-25 DIAGNOSIS — R0902 Hypoxemia: Secondary | ICD-10-CM | POA: Diagnosis not present

## 2018-05-25 DIAGNOSIS — R52 Pain, unspecified: Secondary | ICD-10-CM | POA: Diagnosis not present

## 2018-05-25 DIAGNOSIS — R1084 Generalized abdominal pain: Secondary | ICD-10-CM | POA: Diagnosis not present

## 2018-05-25 MED ORDER — MORPHINE SULFATE (PF) 4 MG/ML IV SOLN
4.0000 mg | Freq: Once | INTRAVENOUS | Status: AC
  Administered 2018-05-25: 4 mg via INTRAVENOUS
  Filled 2018-05-25: qty 1

## 2018-05-25 NOTE — ED Notes (Signed)
Pt asked for something for dry mouth, pt was given mouth swabs, and vitals were updated.

## 2018-05-25 NOTE — ED Notes (Signed)
ED TO INPATIENT HANDOFF REPORT   S Name/Age/Gender Colton Thompson 73 y.o. male Room/Bed: ED12A/ED12A  Code Status   Code Status: Prior  Home/SNF/Other [place] Patient oriented to: self, place, time and situation Is this baseline? Yes   Triage Complete: Triage complete  Chief Complaint Abd pain  Triage Note Pt to triage via w/c, appears uncomfortable; home mask in place; pt with hx pancreatic CA; c/o lower abd pain and distention and constipation; pt has portacath, last chemo 2wks ago   Allergies No Known Allergies  Level of Care/Admitting Diagnosis ED Disposition    None      B Medical/Surgery History Past Medical History:  Diagnosis Date  . Chronic systolic CHF (congestive heart failure) ([place])    a. 03/2016 Echo: EF 20-25%, diff HK, mild to mod MR, mildly dil LA, mild to mod TR, PASP 33mmHg.  . CKD (chronic kidney disease), stage IV ([place])   . DM (diabetes mellitus), type 2 ([place])   . HTN (hypertension)   . NICM (nonischemic cardiomyopathy) ([place])    a. 03/2016 Echo: Ef 20-25%.  . Non-obstructive CAD (coronary artery disease)    a. 03/2016 Cath: LM nl, LAD 60p, 43m, 60d, D1/2 min irregs, D3 nl, LCX 70ost/p, OM1/2/3 nl, RCA 60p, 61m/d, RPDA min irregs.  . Obstructive sleep apnea   . Poor circulation 2009   [facility]  . Tobacco abuse    Past Surgical History:  Procedure Laterality Date  . ABDOMINAL HERNIA REPAIR    . CARDIAC CATHETERIZATION    . RIGHT/LEFT HEART CATH AND CORONARY ANGIOGRAPHY N/A 04/03/2016   Procedure: Right/Left Heart Cath and Coronary Angiography;  Surgeon: Wellington Hampshire, MD;  Location: [facility];  Service: Cardiovascular;  Laterality: N/A;     A IV Location/Drains/Wounds Patient Lines/Drains/Airways Status   Active Line/Drains/Airways    None          Intake/Output Last 24 hours No intake or output data in the 24 hours ending 05/25/18 0148  Labs/Imaging Results for orders placed or performed during the hospital  encounter of 05/24/18 (from the past 48 hour(s))  CBC     Status: Abnormal   Collection Time: 05/24/18 10:26 PM  Result Value Ref Range   WBC 5.9 4.0 - 10.5 K/uL   RBC 3.23 (L) 4.22 - 5.81 MIL/uL   Hemoglobin 9.4 (L) 13.0 - 17.0 g/dL   HCT 29.9 (L) 39.0 - 52.0 %   MCV 92.6 80.0 - 100.0 fL   MCH 29.1 26.0 - 34.0 pg   MCHC 31.4 30.0 - 36.0 g/dL   RDW 13.9 11.5 - 15.5 %   Platelets 173 150 - 400 K/uL   nRBC 0.0 0.0 - 0.2 %    Comment: Performed at [facility], [address] [place], [place] [address]  Comprehensive metabolic panel     Status: Abnormal   Collection Time: 05/24/18 10:26 PM  Result Value Ref Range   Sodium 140 135 - 145 mmol/L   Potassium 3.9 3.5 - 5.1 mmol/L   Chloride 104 98 - 111 mmol/L   CO2 27 22 - 32 mmol/L   Glucose, Bld 181 (H) 70 - 99 mg/dL   BUN 39 (H) 8 - 23 mg/dL   Creatinine, Ser 3.86 (H) 0.61 - 1.24 mg/dL   Calcium 7.6 (L) 8.9 - 10.3 mg/dL   Total Protein 5.4 (L) 6.5 - 8.1 g/dL   Albumin 1.9 (L) 3.5 - 5.0 g/dL   AST 25 15 - 41  U/L   ALT 14 0 - 44 U/L   Alkaline Phosphatase 104 38 - 126 U/L   Total Bilirubin 0.4 0.3 - 1.2 mg/dL   GFR calc non Af Amer 15 (L) >60 mL/min   GFR calc Af Amer 17 (L) >60 mL/min   Anion gap 9 5 - 15    Comment: Performed at [facility], [address] [place], [place] [address]  Lipase, blood     Status: None   Collection Time: 05/24/18 10:26 PM  Result Value Ref Range   Lipase 14 11 - 51 U/L    Comment: Performed at [facility], [address] [place], [place] [address]   No results found.  Pending Labs Unresulted Labs (From admission, onward)   None      Vitals/Pain Today's Vitals   05/25/18 0047 05/25/18 0047 05/25/18 0100 05/25/18 0130  BP:   (!) 147/75 134/80  Pulse:   80 80  Resp:   17 18  Temp:      TempSrc:      SpO2:   95% 93%  Weight:      Height:      PainSc: 5  5       Isolation Precautions No active isolations  Medications Medications  morphine 2  MG/ML injection 2 mg (2 mg Intravenous Given 05/24/18 2232)  ondansetron (ZOFRAN) injection 4 mg (4 mg Intravenous Given 05/24/18 2232)  morphine 4 MG/ML injection 4 mg (4 mg Intravenous Given 05/24/18 2253)  morphine 4 MG/ML injection 4 mg (4 mg Intravenous Given 05/25/18 0016)    Mobility walks Low fall risk   Focused Assessments res   R Recommendations: See Admitting Provider Note  Report given to:   Additional Notes:

## 2018-05-30 DIAGNOSIS — Z87891 Personal history of nicotine dependence: Secondary | ICD-10-CM | POA: Diagnosis not present

## 2018-05-30 DIAGNOSIS — N400 Enlarged prostate without lower urinary tract symptoms: Secondary | ICD-10-CM | POA: Diagnosis not present

## 2018-05-30 DIAGNOSIS — I130 Hypertensive heart and chronic kidney disease with heart failure and stage 1 through stage 4 chronic kidney disease, or unspecified chronic kidney disease: Secondary | ICD-10-CM | POA: Diagnosis not present

## 2018-05-30 DIAGNOSIS — I509 Heart failure, unspecified: Secondary | ICD-10-CM | POA: Diagnosis not present

## 2018-05-30 DIAGNOSIS — C250 Malignant neoplasm of head of pancreas: Secondary | ICD-10-CM | POA: Diagnosis not present

## 2018-05-30 DIAGNOSIS — R180 Malignant ascites: Secondary | ICD-10-CM | POA: Diagnosis not present

## 2018-05-30 DIAGNOSIS — K219 Gastro-esophageal reflux disease without esophagitis: Secondary | ICD-10-CM | POA: Diagnosis not present

## 2018-05-30 DIAGNOSIS — N184 Chronic kidney disease, stage 4 (severe): Secondary | ICD-10-CM | POA: Diagnosis not present

## 2018-05-30 DIAGNOSIS — D630 Anemia in neoplastic disease: Secondary | ICD-10-CM | POA: Diagnosis not present

## 2018-05-30 DIAGNOSIS — E785 Hyperlipidemia, unspecified: Secondary | ICD-10-CM | POA: Diagnosis not present

## 2018-05-30 DIAGNOSIS — E46 Unspecified protein-calorie malnutrition: Secondary | ICD-10-CM | POA: Diagnosis not present

## 2018-05-30 DIAGNOSIS — J449 Chronic obstructive pulmonary disease, unspecified: Secondary | ICD-10-CM | POA: Diagnosis not present

## 2018-05-30 DIAGNOSIS — C787 Secondary malignant neoplasm of liver and intrahepatic bile duct: Secondary | ICD-10-CM | POA: Diagnosis not present

## 2018-05-30 DIAGNOSIS — E1122 Type 2 diabetes mellitus with diabetic chronic kidney disease: Secondary | ICD-10-CM | POA: Diagnosis not present

## 2018-06-01 DIAGNOSIS — C787 Secondary malignant neoplasm of liver and intrahepatic bile duct: Secondary | ICD-10-CM | POA: Diagnosis not present

## 2018-06-01 DIAGNOSIS — I130 Hypertensive heart and chronic kidney disease with heart failure and stage 1 through stage 4 chronic kidney disease, or unspecified chronic kidney disease: Secondary | ICD-10-CM | POA: Diagnosis not present

## 2018-06-01 DIAGNOSIS — E1122 Type 2 diabetes mellitus with diabetic chronic kidney disease: Secondary | ICD-10-CM | POA: Diagnosis not present

## 2018-06-01 DIAGNOSIS — R180 Malignant ascites: Secondary | ICD-10-CM | POA: Diagnosis not present

## 2018-06-01 DIAGNOSIS — I509 Heart failure, unspecified: Secondary | ICD-10-CM | POA: Diagnosis not present

## 2018-06-01 DIAGNOSIS — C250 Malignant neoplasm of head of pancreas: Secondary | ICD-10-CM | POA: Diagnosis not present

## 2018-06-07 DIAGNOSIS — I130 Hypertensive heart and chronic kidney disease with heart failure and stage 1 through stage 4 chronic kidney disease, or unspecified chronic kidney disease: Secondary | ICD-10-CM | POA: Diagnosis not present

## 2018-06-07 DIAGNOSIS — C250 Malignant neoplasm of head of pancreas: Secondary | ICD-10-CM | POA: Diagnosis not present

## 2018-06-07 DIAGNOSIS — C787 Secondary malignant neoplasm of liver and intrahepatic bile duct: Secondary | ICD-10-CM | POA: Diagnosis not present

## 2018-06-07 DIAGNOSIS — R180 Malignant ascites: Secondary | ICD-10-CM | POA: Diagnosis not present

## 2018-06-07 DIAGNOSIS — I509 Heart failure, unspecified: Secondary | ICD-10-CM | POA: Diagnosis not present

## 2018-06-07 DIAGNOSIS — E1122 Type 2 diabetes mellitus with diabetic chronic kidney disease: Secondary | ICD-10-CM | POA: Diagnosis not present

## 2018-06-17 DIAGNOSIS — E1122 Type 2 diabetes mellitus with diabetic chronic kidney disease: Secondary | ICD-10-CM | POA: Diagnosis not present

## 2018-06-17 DIAGNOSIS — I509 Heart failure, unspecified: Secondary | ICD-10-CM | POA: Diagnosis not present

## 2018-06-17 DIAGNOSIS — D630 Anemia in neoplastic disease: Secondary | ICD-10-CM | POA: Diagnosis not present

## 2018-06-17 DIAGNOSIS — E46 Unspecified protein-calorie malnutrition: Secondary | ICD-10-CM | POA: Diagnosis not present

## 2018-06-17 DIAGNOSIS — J449 Chronic obstructive pulmonary disease, unspecified: Secondary | ICD-10-CM | POA: Diagnosis not present

## 2018-06-17 DIAGNOSIS — Z87891 Personal history of nicotine dependence: Secondary | ICD-10-CM | POA: Diagnosis not present

## 2018-06-17 DIAGNOSIS — K219 Gastro-esophageal reflux disease without esophagitis: Secondary | ICD-10-CM | POA: Diagnosis not present

## 2018-06-17 DIAGNOSIS — N400 Enlarged prostate without lower urinary tract symptoms: Secondary | ICD-10-CM | POA: Diagnosis not present

## 2018-06-17 DIAGNOSIS — C250 Malignant neoplasm of head of pancreas: Secondary | ICD-10-CM | POA: Diagnosis not present

## 2018-06-17 DIAGNOSIS — E785 Hyperlipidemia, unspecified: Secondary | ICD-10-CM | POA: Diagnosis not present

## 2018-06-17 DIAGNOSIS — C787 Secondary malignant neoplasm of liver and intrahepatic bile duct: Secondary | ICD-10-CM | POA: Diagnosis not present

## 2018-06-17 DIAGNOSIS — R180 Malignant ascites: Secondary | ICD-10-CM | POA: Diagnosis not present

## 2018-06-17 DIAGNOSIS — N184 Chronic kidney disease, stage 4 (severe): Secondary | ICD-10-CM | POA: Diagnosis not present

## 2018-06-17 DIAGNOSIS — I130 Hypertensive heart and chronic kidney disease with heart failure and stage 1 through stage 4 chronic kidney disease, or unspecified chronic kidney disease: Secondary | ICD-10-CM | POA: Diagnosis not present

## 2018-06-25 DIAGNOSIS — C787 Secondary malignant neoplasm of liver and intrahepatic bile duct: Secondary | ICD-10-CM | POA: Diagnosis not present

## 2018-06-25 DIAGNOSIS — R180 Malignant ascites: Secondary | ICD-10-CM | POA: Diagnosis not present

## 2018-06-25 DIAGNOSIS — I509 Heart failure, unspecified: Secondary | ICD-10-CM | POA: Diagnosis not present

## 2018-06-25 DIAGNOSIS — E1122 Type 2 diabetes mellitus with diabetic chronic kidney disease: Secondary | ICD-10-CM | POA: Diagnosis not present

## 2018-06-25 DIAGNOSIS — C250 Malignant neoplasm of head of pancreas: Secondary | ICD-10-CM | POA: Diagnosis not present

## 2018-06-25 DIAGNOSIS — I130 Hypertensive heart and chronic kidney disease with heart failure and stage 1 through stage 4 chronic kidney disease, or unspecified chronic kidney disease: Secondary | ICD-10-CM | POA: Diagnosis not present

## 2018-06-27 DIAGNOSIS — C250 Malignant neoplasm of head of pancreas: Secondary | ICD-10-CM | POA: Diagnosis not present

## 2018-06-27 DIAGNOSIS — I130 Hypertensive heart and chronic kidney disease with heart failure and stage 1 through stage 4 chronic kidney disease, or unspecified chronic kidney disease: Secondary | ICD-10-CM | POA: Diagnosis not present

## 2018-06-27 DIAGNOSIS — I509 Heart failure, unspecified: Secondary | ICD-10-CM | POA: Diagnosis not present

## 2018-06-27 DIAGNOSIS — C787 Secondary malignant neoplasm of liver and intrahepatic bile duct: Secondary | ICD-10-CM | POA: Diagnosis not present

## 2018-06-27 DIAGNOSIS — E1122 Type 2 diabetes mellitus with diabetic chronic kidney disease: Secondary | ICD-10-CM | POA: Diagnosis not present

## 2018-06-27 DIAGNOSIS — R180 Malignant ascites: Secondary | ICD-10-CM | POA: Diagnosis not present

## 2018-06-30 DIAGNOSIS — I509 Heart failure, unspecified: Secondary | ICD-10-CM | POA: Diagnosis not present

## 2018-06-30 DIAGNOSIS — E1122 Type 2 diabetes mellitus with diabetic chronic kidney disease: Secondary | ICD-10-CM | POA: Diagnosis not present

## 2018-06-30 DIAGNOSIS — C787 Secondary malignant neoplasm of liver and intrahepatic bile duct: Secondary | ICD-10-CM | POA: Diagnosis not present

## 2018-06-30 DIAGNOSIS — R180 Malignant ascites: Secondary | ICD-10-CM | POA: Diagnosis not present

## 2018-06-30 DIAGNOSIS — C250 Malignant neoplasm of head of pancreas: Secondary | ICD-10-CM | POA: Diagnosis not present

## 2018-06-30 DIAGNOSIS — I130 Hypertensive heart and chronic kidney disease with heart failure and stage 1 through stage 4 chronic kidney disease, or unspecified chronic kidney disease: Secondary | ICD-10-CM | POA: Diagnosis not present

## 2018-07-18 ENCOUNTER — Ambulatory Visit: Payer: No Typology Code available for payment source | Admitting: Podiatry

## 2018-07-18 DEATH — deceased
